# Patient Record
Sex: Male | Born: 1955 | Race: White | Hispanic: No | Smoking: Current every day smoker
Health system: Southern US, Community
[De-identification: ages and names within clinical notes are randomized; demographics above are authoritative.]

## PROBLEM LIST (undated history)

## (undated) DIAGNOSIS — I10 Essential (primary) hypertension: Secondary | ICD-10-CM

## (undated) DIAGNOSIS — T79A21A Traumatic compartment syndrome of right lower extremity, initial encounter: Secondary | ICD-10-CM

## (undated) DIAGNOSIS — K219 Gastro-esophageal reflux disease without esophagitis: Secondary | ICD-10-CM

## (undated) DIAGNOSIS — M978XXA Periprosthetic fracture around other internal prosthetic joint, initial encounter: Secondary | ICD-10-CM

## (undated) DIAGNOSIS — F410 Panic disorder [episodic paroxysmal anxiety] without agoraphobia: Secondary | ICD-10-CM

## (undated) DIAGNOSIS — M549 Dorsalgia, unspecified: Secondary | ICD-10-CM

## (undated) DIAGNOSIS — M1990 Unspecified osteoarthritis, unspecified site: Secondary | ICD-10-CM

## (undated) DIAGNOSIS — Z96659 Presence of unspecified artificial knee joint: Secondary | ICD-10-CM

---

## 2004-01-17 ENCOUNTER — Emergency Department (HOSPITAL_COMMUNITY): Admission: EM | Admit: 2004-01-17 | Discharge: 2004-01-17 | Payer: Self-pay | Admitting: Emergency Medicine

## 2005-12-24 ENCOUNTER — Emergency Department (HOSPITAL_COMMUNITY): Admission: EM | Admit: 2005-12-24 | Discharge: 2005-12-24 | Payer: Self-pay | Admitting: *Deleted

## 2008-04-07 ENCOUNTER — Emergency Department (HOSPITAL_COMMUNITY): Admission: EM | Admit: 2008-04-07 | Discharge: 2008-04-07 | Payer: Self-pay | Admitting: Emergency Medicine

## 2008-06-01 ENCOUNTER — Emergency Department (HOSPITAL_COMMUNITY): Admission: EM | Admit: 2008-06-01 | Discharge: 2008-06-01 | Payer: Self-pay | Admitting: Emergency Medicine

## 2008-10-20 ENCOUNTER — Emergency Department (HOSPITAL_BASED_OUTPATIENT_CLINIC_OR_DEPARTMENT_OTHER): Admission: EM | Admit: 2008-10-20 | Discharge: 2008-10-20 | Payer: Self-pay | Admitting: Emergency Medicine

## 2008-10-20 ENCOUNTER — Ambulatory Visit: Payer: Self-pay | Admitting: Radiology

## 2009-06-23 ENCOUNTER — Ambulatory Visit: Payer: Self-pay | Admitting: Radiology

## 2009-06-23 ENCOUNTER — Emergency Department (HOSPITAL_BASED_OUTPATIENT_CLINIC_OR_DEPARTMENT_OTHER): Admission: EM | Admit: 2009-06-23 | Discharge: 2009-06-23 | Payer: Self-pay | Admitting: Emergency Medicine

## 2010-08-29 LAB — URINE MICROSCOPIC-ADD ON

## 2010-08-29 LAB — COMPREHENSIVE METABOLIC PANEL
AST: 29 U/L (ref 0–37)
Albumin: 3.7 g/dL (ref 3.5–5.2)
CO2: 26 mEq/L (ref 19–32)
Calcium: 9.1 mg/dL (ref 8.4–10.5)
Chloride: 99 mEq/L (ref 96–112)
GFR calc Af Amer: >60 mL/min (ref >60)
GFR calc non Af Amer: >60 mL/min (ref >60)
Sodium: 135 mEq/L (ref 135–145)
Total Bilirubin: 1.1 mg/dL (ref 0.3–1.2)
Total Protein: 7.1 g/dL (ref 6.0–8.3)

## 2010-08-29 LAB — GC/CHLAMYDIA PROBE AMP, GENITAL
Chlamydia, DNA Probe: NEGATIVE
GC Probe Amp, Genital: NEGATIVE

## 2010-08-29 LAB — DIFFERENTIAL
Basophils Absolute: 0.1 10*3/uL (ref 0.0–0.1)
Basophils Relative: 1 % (ref 0–1)
Eosinophils Relative: 0 % (ref 0–5)
Neutrophils Relative %: 79 % — ABNORMAL HIGH (ref 43–77)

## 2010-08-29 LAB — CBC
HCT: 46.1 % (ref 39.0–52.0)
MCV: 100.3 fL — ABNORMAL HIGH (ref 78.0–100.0)
Platelets: 216 10*3/uL (ref 150–400)
RDW: 14.3 % (ref 11.5–15.5)
WBC: 9.2 10*3/uL (ref 4.0–10.5)

## 2010-08-29 LAB — URINALYSIS, ROUTINE W REFLEX MICROSCOPIC
Glucose, UA: NEGATIVE mg/dL
Specific Gravity, Urine: 1.021 (ref 1.005–1.030)

## 2010-11-23 ENCOUNTER — Encounter: Payer: Self-pay | Admitting: *Deleted

## 2010-11-23 ENCOUNTER — Emergency Department (HOSPITAL_BASED_OUTPATIENT_CLINIC_OR_DEPARTMENT_OTHER)
Admission: EM | Admit: 2010-11-23 | Discharge: 2010-11-23 | Disposition: A | Discharge status: Home / Self Care | Payer: Self-pay | Attending: Emergency Medicine | Admitting: Emergency Medicine

## 2010-11-23 DIAGNOSIS — F411 Generalized anxiety disorder: Secondary | ICD-10-CM | POA: Insufficient documentation

## 2010-11-23 DIAGNOSIS — M549 Dorsalgia, unspecified: Secondary | ICD-10-CM | POA: Insufficient documentation

## 2010-11-23 DIAGNOSIS — F410 Panic disorder [episodic paroxysmal anxiety] without agoraphobia: Secondary | ICD-10-CM | POA: Insufficient documentation

## 2010-11-23 HISTORY — DX: Panic disorder (episodic paroxysmal anxiety): F41.0

## 2010-11-23 HISTORY — DX: Dorsalgia, unspecified: M54.9

## 2010-11-23 MED ORDER — TRAZODONE HCL 50 MG PO TABS: 50.0000 mg | ORAL_TABLET | Freq: Every day | ORAL | Status: DC

## 2010-11-23 MED ORDER — ALPRAZOLAM 1 MG PO TABS: 1.0000 mg | ORAL_TABLET | Freq: Three times a day (TID) | ORAL | Status: AC

## 2010-11-23 MED ORDER — PAROXETINE HCL 20 MG PO TABS: 40.0000 mg | ORAL_TABLET | ORAL | Status: DC

## 2010-11-23 NOTE — ED Notes (Signed)
States has been out of meds x 2 months and has 10 days to go before his next appointment for his panic attacks.  Normally takes paxil   Xanax    And trazadone.  Denies pain.  anxious

## 2010-11-23 NOTE — ED Notes (Signed)
Regular meds are paxil CR 25mg  x3 in the am     Xanax0..1mg  x3 a day    And trazadone 100mg  x2 at bedtime

## 2010-11-23 NOTE — ED Provider Notes (Signed)
History   pt here with worsening panic and anxiety x 1 month--on no meds x 1 month--denies Hi/SI--under lots of stress, has follow-up appointment in 10 days scheudled, requesting refill of meds--denies recent etoh use--no other c/o  Chief Complaint  Patient presents with  . Panic Attack   HPI  Past Medical History  Diagnosis Date  . Panic attack   . Back pain     History reviewed. No pertinent past surgical history.  History reviewed. No pertinent family history.  History  Substance Use Topics  . Smoking status: Current Everyday Smoker -- 0.5 packs/day  . Smokeless tobacco: Not on file  . Alcohol Use: No      Review of Systems  All other systems reviewed and are negative.    Physical Exam  BP 140/112  Pulse 92  Temp(Src) 98.5 F (36.9 C) (Oral)  Resp 16  Wt 200 lb (90.719 kg)  SpO2 99%  Physical Exam  Nursing note and vitals reviewed. Constitutional: He is oriented to person, place, and time. Vital signs are normal. He appears well-developed and well-nourished.  Non-toxic appearance. No distress.  HENT:  Head: Normocephalic and atraumatic.  Eyes: Conjunctivae and EOM are normal. Pupils are equal, round, and reactive to light.  Neck: Normal range of motion. Neck supple. No tracheal deviation present.  Cardiovascular: Normal rate, regular rhythm and normal heart sounds.  Exam reveals no gallop.   No murmur heard. Pulmonary/Chest: Effort normal and breath sounds normal. No stridor. No respiratory distress. He has no wheezes.  Abdominal: Soft. Normal appearance and bowel sounds are normal. He exhibits no distension. There is no tenderness. There is no rebound.  Musculoskeletal: Normal range of motion. He exhibits no edema and no tenderness.  Neurological: He is alert and oriented to person, place, and time. He has normal strength. No cranial nerve deficit or sensory deficit. GCS eye subscore is 4. GCS verbal subscore is 5. GCS motor subscore is 6.  Skin: Skin is  warm and dry.  Psychiatric: His speech is normal. His mood appears anxious.    ED Course  Procedures  MDM Will refill his meds and he will f/u with his new psychiatrist as already scheduled      Toy Baker, MD 11/23/10 1709

## 2010-11-23 NOTE — ED Notes (Signed)
Pt c/o panic attacks  X 1 month , pt states he is out of paxil, trazadone, xanax.

## 2010-11-23 NOTE — ED Notes (Signed)
Much calmer.  

## 2010-12-23 NOTE — ED Notes (Signed)
Much calmer.  

## 2011-07-20 ENCOUNTER — Emergency Department (HOSPITAL_BASED_OUTPATIENT_CLINIC_OR_DEPARTMENT_OTHER)
Admission: EM | Admit: 2011-07-20 | Discharge: 2011-07-20 | Disposition: A | Discharge status: Home / Self Care | Payer: Self-pay | Attending: Emergency Medicine | Admitting: Emergency Medicine

## 2011-07-20 ENCOUNTER — Encounter (HOSPITAL_BASED_OUTPATIENT_CLINIC_OR_DEPARTMENT_OTHER): Payer: Self-pay | Admitting: *Deleted

## 2011-07-20 DIAGNOSIS — X04XXXA Exposure to ignition of highly flammable material, initial encounter: Secondary | ICD-10-CM | POA: Insufficient documentation

## 2011-07-20 DIAGNOSIS — T23269A Burn of second degree of back of unspecified hand, initial encounter: Secondary | ICD-10-CM | POA: Insufficient documentation

## 2011-07-20 DIAGNOSIS — Z880 Allergy status to penicillin: Secondary | ICD-10-CM | POA: Insufficient documentation

## 2011-07-20 DIAGNOSIS — F410 Panic disorder [episodic paroxysmal anxiety] without agoraphobia: Secondary | ICD-10-CM | POA: Insufficient documentation

## 2011-07-20 DIAGNOSIS — T2210XA Burn of first degree of shoulder and upper limb, except wrist and hand, unspecified site, initial encounter: Secondary | ICD-10-CM

## 2011-07-20 DIAGNOSIS — T23202A Burn of second degree of left hand, unspecified site, initial encounter: Secondary | ICD-10-CM

## 2011-07-20 DIAGNOSIS — T2014XA Burn of first degree of nose (septum), initial encounter: Secondary | ICD-10-CM | POA: Insufficient documentation

## 2011-07-20 DIAGNOSIS — T2010XA Burn of first degree of head, face, and neck, unspecified site, initial encounter: Secondary | ICD-10-CM

## 2011-07-20 DIAGNOSIS — F17200 Nicotine dependence, unspecified, uncomplicated: Secondary | ICD-10-CM | POA: Insufficient documentation

## 2011-07-20 MED ORDER — OXYCODONE-ACETAMINOPHEN 5-325 MG PO TABS
2.0000 | ORAL_TABLET | Freq: Once | ORAL | Status: AC
  Administered 2011-07-20: 2 via ORAL
  Filled 2011-07-20: qty 2

## 2011-07-20 MED ORDER — OXYCODONE-ACETAMINOPHEN 5-325 MG PO TABS: 1.0000 | ORAL_TABLET | ORAL | Status: AC | PRN

## 2011-07-20 MED ORDER — SILVER SULFADIAZINE 1 % EX CREA
TOPICAL_CREAM | Freq: Once | CUTANEOUS | Status: AC
  Administered 2011-07-20: 1 via TOPICAL

## 2011-07-20 MED ORDER — SILVER SULFADIAZINE 1 % EX CREA
TOPICAL_CREAM | CUTANEOUS | Status: AC
  Administered 2011-07-20: 1 via TOPICAL
  Filled 2011-07-20: qty 85
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
## 2011-08-02 ENCOUNTER — Emergency Department (HOSPITAL_BASED_OUTPATIENT_CLINIC_OR_DEPARTMENT_OTHER)
Admission: EM | Admit: 2011-08-02 | Discharge: 2011-08-02 | Disposition: A | Discharge status: Home Health | Payer: Self-pay | Attending: Emergency Medicine | Admitting: Emergency Medicine

## 2011-08-02 ENCOUNTER — Emergency Department (INDEPENDENT_AMBULATORY_CARE_PROVIDER_SITE_OTHER): Payer: Self-pay

## 2011-08-02 ENCOUNTER — Other Ambulatory Visit: Payer: Self-pay

## 2011-08-02 ENCOUNTER — Encounter (HOSPITAL_BASED_OUTPATIENT_CLINIC_OR_DEPARTMENT_OTHER): Payer: Self-pay

## 2011-08-02 DIAGNOSIS — R0789 Other chest pain: Secondary | ICD-10-CM

## 2011-08-02 DIAGNOSIS — R0602 Shortness of breath: Secondary | ICD-10-CM

## 2011-08-02 DIAGNOSIS — F17200 Nicotine dependence, unspecified, uncomplicated: Secondary | ICD-10-CM | POA: Insufficient documentation

## 2011-08-02 DIAGNOSIS — R079 Chest pain, unspecified: Secondary | ICD-10-CM

## 2011-08-02 LAB — COMPREHENSIVE METABOLIC PANEL
AST: 34 U/L (ref 0–37)
Albumin: 3.7 g/dL (ref 3.5–5.2)
CO2: 27 mEq/L (ref 19–32)
Calcium: 9.5 mg/dL (ref 8.4–10.5)
Creatinine, Ser: 1.1 mg/dL (ref 0.50–1.35)
GFR calc non Af Amer: 73 mL/min — ABNORMAL LOW (ref >90)
Potassium: 3.9 mEq/L (ref 3.5–5.1)
Total Protein: 7.5 g/dL (ref 6.0–8.3)

## 2011-08-02 LAB — DIFFERENTIAL
Basophils Relative: 1 % (ref 0–1)
Eosinophils Absolute: 0.2 10*3/uL (ref 0.0–0.7)
Lymphocytes Relative: 17 % (ref 12–46)
Lymphs Abs: 1.4 10*3/uL (ref 0.7–4.0)
Neutro Abs: 6 10*3/uL (ref 1.7–7.7)

## 2011-08-02 LAB — CBC
HCT: 44.2 % (ref 39.0–52.0)
Hemoglobin: 15.4 g/dL (ref 13.0–17.0)
MCH: 32.8 pg (ref 26.0–34.0)

## 2011-08-02 LAB — CARDIAC PANEL(CRET KIN+CKTOT+MB+TROPI)
CK, MB: 3.4 ng/mL (ref 0.3–4.0)
Total CK: 222 U/L (ref 7–232)

## 2011-08-02 MED ORDER — IBUPROFEN 600 MG PO TABS: 600.0000 mg | ORAL_TABLET | Freq: Four times a day (QID) | ORAL | Status: AC | PRN

## 2011-08-02 MED ORDER — HYDROCODONE-ACETAMINOPHEN 5-325 MG PO TABS: 2.0000 | ORAL_TABLET | ORAL | Status: AC | PRN

## 2011-08-02 NOTE — ED Notes (Signed)
Pt states he has had pain in chest for 24hrs

## 2011-08-02 NOTE — Discharge Instructions (Signed)
Chest Wall Pain Chest wall pain is pain in or around the bones and muscles of your chest. It may take up to 6 weeks to get better. It may take longer if you must stay physically active in your work and activities.  CAUSES  Chest wall pain may happen on its own. However, it may be caused by:  A viral illness like the flu.   Injury.   Coughing.   Exercise.   Arthritis.   Fibromyalgia.   Shingles.  HOME CARE INSTRUCTIONS   Avoid overtiring physical activity. Try not to strain or perform activities that cause pain. This includes any activities using your chest or your abdominal and side muscles, especially if heavy weights are used.   Put ice on the sore area.   Put ice in a plastic bag.   Place a towel between your skin and the bag.   Leave the ice on for 15 to 20 minutes per hour while awake for the first 2 days.   Only take over-the-counter or prescription medicines for pain, discomfort, or fever as directed by your caregiver.  SEEK IMMEDIATE MEDICAL CARE IF:   Your pain increases, or you are very uncomfortable.   You have a fever.   Your chest pain becomes worse.   You have new, unexplained symptoms.   You have nausea or vomiting.   You feel sweaty or lightheaded.   You have a cough with phlegm (sputum), or you cough up blood.  MAKE SURE YOU:   Understand these instructions.   Will watch your condition.   Will get help right away if you are not doing well or get worse.  Document Released: 05/01/2005 Document Revised: 04/20/2011 Document Reviewed: 12/26/2010 [employer] Patient Information 2012 [employer]. 
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
## 2013-10-14 ENCOUNTER — Other Ambulatory Visit: Payer: Self-pay | Admitting: Orthopedic Surgery

## 2013-10-17 NOTE — Progress Notes (Signed)
I was unable to reach patient by phone.  I left  A message on voice mail.  I instructed the patient to arrive at [facility] Main entrance at 10:00 on Monday, June 8  , nothing to eat or drink after midnight.   I instructed the patient to take the following medications in the am with just enough water to get them down: Paxil, Xanax if needed. I asked patient to not wear any lotions, powders, cologne, jewelry, piercing, make-up or nail polish.  I asked the patient to call [phone], in the am if there were any questions or problems.

## 2013-10-19 MED ORDER — CLINDAMYCIN PHOSPHATE 900 MG/50ML IV SOLN
900.0000 mg | INTRAVENOUS | Status: AC
  Administered 2013-10-20: 900 mg via INTRAVENOUS
  Filled 2013-10-19: qty 50

## 2013-10-20 ENCOUNTER — Inpatient Hospital Stay (HOSPITAL_COMMUNITY): Payer: BC Managed Care – PPO

## 2013-10-20 ENCOUNTER — Encounter (HOSPITAL_COMMUNITY): Payer: BC Managed Care – PPO | Admitting: Anesthesiology

## 2013-10-20 ENCOUNTER — Inpatient Hospital Stay (HOSPITAL_COMMUNITY): Payer: BC Managed Care – PPO | Admitting: Anesthesiology

## 2013-10-20 ENCOUNTER — Encounter (HOSPITAL_COMMUNITY): Payer: Self-pay | Admitting: *Deleted

## 2013-10-20 ENCOUNTER — Inpatient Hospital Stay (HOSPITAL_COMMUNITY)
Admission: RE | Admit: 2013-10-20 | Discharge: 2013-10-22 | DRG: 470 | Disposition: A | Discharge status: Home Health | Payer: BC Managed Care – PPO | Source: Ambulatory Visit | Attending: Orthopedic Surgery | Admitting: Orthopedic Surgery

## 2013-10-20 ENCOUNTER — Encounter (HOSPITAL_COMMUNITY)
Admission: RE | Disposition: A | Discharge status: Home Health | Payer: Self-pay | Source: Ambulatory Visit | Attending: Orthopedic Surgery

## 2013-10-20 DIAGNOSIS — M1710 Unilateral primary osteoarthritis, unspecified knee: Principal | ICD-10-CM | POA: Diagnosis present

## 2013-10-20 DIAGNOSIS — F411 Generalized anxiety disorder: Secondary | ICD-10-CM | POA: Diagnosis present

## 2013-10-20 DIAGNOSIS — K219 Gastro-esophageal reflux disease without esophagitis: Secondary | ICD-10-CM | POA: Diagnosis present

## 2013-10-20 DIAGNOSIS — Z7982 Long term (current) use of aspirin: Secondary | ICD-10-CM

## 2013-10-20 DIAGNOSIS — M1711 Unilateral primary osteoarthritis, right knee: Secondary | ICD-10-CM | POA: Diagnosis present

## 2013-10-20 DIAGNOSIS — F17200 Nicotine dependence, unspecified, uncomplicated: Secondary | ICD-10-CM | POA: Diagnosis present

## 2013-10-20 DIAGNOSIS — Z79899 Other long term (current) drug therapy: Secondary | ICD-10-CM

## 2013-10-20 HISTORY — PX: TOTAL KNEE ARTHROPLASTY: SHX125

## 2013-10-20 HISTORY — DX: Gastro-esophageal reflux disease without esophagitis: K21.9

## 2013-10-20 HISTORY — DX: Unspecified osteoarthritis, unspecified site: M19.90

## 2013-10-20 LAB — URINALYSIS, ROUTINE W REFLEX MICROSCOPIC

## 2013-10-20 LAB — CBC WITH DIFFERENTIAL/PLATELET
BASOS PCT: 1 % (ref 0–1)
Basophils Absolute: 0 10*3/uL (ref 0.0–0.1)
Eosinophils Absolute: 0.3 10*3/uL (ref 0.0–0.7)
Eosinophils Relative: 4 % (ref 0–5)
HEMATOCRIT: 44.3 % (ref 39.0–52.0)
Hemoglobin: 14.6 g/dL (ref 13.0–17.0)
LYMPHS PCT: 28 % (ref 12–46)
Lymphs Abs: 2.2 10*3/uL (ref 0.7–4.0)
MCH: 32.9 pg (ref 26.0–34.0)
MCHC: 33 g/dL (ref 30.0–36.0)
MCV: 99.8 fL (ref 78.0–100.0)
MONO ABS: 0.8 10*3/uL (ref 0.1–1.0)
Monocytes Relative: 10 % (ref 3–12)
Neutro Abs: 4.5 10*3/uL (ref 1.7–7.7)
Neutrophils Relative %: 57 % (ref 43–77)
Platelets: 246 10*3/uL (ref 150–400)
RBC: 4.44 MIL/uL (ref 4.22–5.81)
RDW: 13.5 % (ref 11.5–15.5)
WBC: 7.9 10*3/uL (ref 4.0–10.5)

## 2013-10-20 LAB — COMPREHENSIVE METABOLIC PANEL
ALT: 37 U/L (ref 0–53)
AST: 29 U/L (ref 0–37)
Albumin: 3.3 g/dL — ABNORMAL LOW (ref 3.5–5.2)
Alkaline Phosphatase: 56 U/L (ref 39–117)
BUN: 12 mg/dL (ref 6–23)
CALCIUM: 9.3 mg/dL (ref 8.4–10.5)
CO2: 28 meq/L (ref 19–32)
CREATININE: 1.07 mg/dL (ref 0.50–1.35)
Chloride: 103 mEq/L (ref 96–112)
GFR calc Af Amer: 85 mL/min — ABNORMAL LOW (ref >90)
GFR, EST NON AFRICAN AMERICAN: 74 mL/min — AB (ref >90)
Glucose, Bld: 84 mg/dL (ref 70–99)
Potassium: 4.1 mEq/L (ref 3.7–5.3)
Sodium: 141 mEq/L (ref 137–147)
Total Bilirubin: 0.3 mg/dL (ref 0.3–1.2)
Total Protein: 6.7 g/dL (ref 6.0–8.3)

## 2013-10-20 LAB — TYPE AND SCREEN
ABO/RH(D): A POS
ANTIBODY SCREEN: NEGATIVE

## 2013-10-20 LAB — PROTIME-INR
INR: 0.92 (ref 0.00–1.49)
Prothrombin Time: 12.2 seconds (ref 11.6–15.2)

## 2013-10-20 LAB — APTT: aPTT: 36 seconds (ref 24–37)

## 2013-10-20 LAB — SURGICAL PCR SCREEN
MRSA, PCR: NEGATIVE
Staphylococcus aureus: NEGATIVE

## 2013-10-20 LAB — ABO/RH: ABO/RH(D): A POS

## 2013-10-20 SURGERY — ARTHROPLASTY, KNEE, TOTAL
Anesthesia: General | Site: Knee | Laterality: Right

## 2013-10-20 MED ORDER — DEXAMETHASONE SODIUM PHOSPHATE 10 MG/ML IJ SOLN
10.0000 mg | Freq: Three times a day (TID) | INTRAMUSCULAR | Status: AC
  Filled 2013-10-20 (×3): qty 1

## 2013-10-20 MED ORDER — LIDOCAINE HCL (CARDIAC) 20 MG/ML IV SOLN
INTRAVENOUS | Status: AC
  Filled 2013-10-20: qty 5

## 2013-10-20 MED ORDER — OXYCODONE-ACETAMINOPHEN 5-325 MG PO TABS
ORAL_TABLET | ORAL | Status: AC
  Administered 2013-10-20: 2 via ORAL
  Filled 2013-10-20: qty 2

## 2013-10-20 MED ORDER — DIPHENHYDRAMINE HCL 12.5 MG/5ML PO ELIX: 12.5000 mg | ORAL_SOLUTION | ORAL | Status: DC | PRN

## 2013-10-20 MED ORDER — OXYCODONE-ACETAMINOPHEN 5-325 MG PO TABS: 1.0000 | ORAL_TABLET | ORAL | Status: DC | PRN

## 2013-10-20 MED ORDER — PROPOFOL 10 MG/ML IV BOLUS
INTRAVENOUS | Status: DC | PRN
  Administered 2013-10-20: 200 mg via INTRAVENOUS

## 2013-10-20 MED ORDER — LACTATED RINGERS IV SOLN
INTRAVENOUS | Status: DC
  Administered 2013-10-20: 11:00:00 via INTRAVENOUS

## 2013-10-20 MED ORDER — KETOROLAC TROMETHAMINE 15 MG/ML IJ SOLN
INTRAMUSCULAR | Status: AC
  Administered 2013-10-20: 15 mg via INTRAVENOUS
  Filled 2013-10-20: qty 1

## 2013-10-20 MED ORDER — ALPRAZOLAM 0.5 MG PO TABS
1.0000 mg | ORAL_TABLET | Freq: Three times a day (TID) | ORAL | Status: DC | PRN
  Administered 2013-10-21 – 2013-10-22 (×4): 1 mg via ORAL
  Filled 2013-10-20 (×4): qty 2

## 2013-10-20 MED ORDER — PAROXETINE HCL 20 MG PO TABS
40.0000 mg | ORAL_TABLET | ORAL | Status: DC
  Administered 2013-10-21 – 2013-10-22 (×2): 40 mg via ORAL
  Filled 2013-10-20 (×3): qty 2

## 2013-10-20 MED ORDER — DEXAMETHASONE SODIUM PHOSPHATE 10 MG/ML IJ SOLN: 10.0000 mg | Freq: Once | INTRAMUSCULAR | Status: DC

## 2013-10-20 MED ORDER — ACETAMINOPHEN 650 MG RE SUPP: 650.0000 mg | Freq: Four times a day (QID) | RECTAL | Status: DC | PRN

## 2013-10-20 MED ORDER — 0.9 % SODIUM CHLORIDE (POUR BTL) OPTIME
TOPICAL | Status: DC | PRN
  Administered 2013-10-20: 1000 mL

## 2013-10-20 MED ORDER — SODIUM CHLORIDE 0.9 % IJ SOLN
INTRAMUSCULAR | Status: DC | PRN
  Administered 2013-10-20: 40 mL

## 2013-10-20 MED ORDER — HYDROMORPHONE HCL PF 1 MG/ML IJ SOLN
INTRAMUSCULAR | Status: AC
  Administered 2013-10-20: 16:00:00
  Filled 2013-10-20: qty 1

## 2013-10-20 MED ORDER — NEOSTIGMINE METHYLSULFATE 10 MG/10ML IV SOLN
INTRAVENOUS | Status: DC | PRN
  Administered 2013-10-20: 4 mg via INTRAVENOUS

## 2013-10-20 MED ORDER — CLINDAMYCIN PHOSPHATE 600 MG/50ML IV SOLN
600.0000 mg | Freq: Four times a day (QID) | INTRAVENOUS | Status: AC
  Administered 2013-10-20 – 2013-10-21 (×2): 600 mg via INTRAVENOUS
  Filled 2013-10-20 (×2): qty 50

## 2013-10-20 MED ORDER — OXYCODONE-ACETAMINOPHEN 5-325 MG PO TABS
1.0000 | ORAL_TABLET | ORAL | Status: DC | PRN
  Administered 2013-10-20 – 2013-10-22 (×8): 2 via ORAL
  Filled 2013-10-20 (×7): qty 2

## 2013-10-20 MED ORDER — METOCLOPRAMIDE HCL 5 MG/ML IJ SOLN: 10.0000 mg | Freq: Once | INTRAMUSCULAR | Status: DC | PRN

## 2013-10-20 MED ORDER — DEXTROSE 5 % IV SOLN
INTRAVENOUS | Status: DC | PRN
  Administered 2013-10-20: 13:00:00 via INTRAVENOUS

## 2013-10-20 MED ORDER — HYDROMORPHONE HCL PF 1 MG/ML IJ SOLN
1.0000 mg | INTRAMUSCULAR | Status: DC | PRN
  Administered 2013-10-20: 1 mg via INTRAVENOUS
  Filled 2013-10-20: qty 1

## 2013-10-20 MED ORDER — BUPIVACAINE LIPOSOME 1.3 % IJ SUSP
20.0000 mL | Freq: Once | INTRAMUSCULAR | Status: DC
  Filled 2013-10-20: qty 20

## 2013-10-20 MED ORDER — POLYETHYLENE GLYCOL 3350 17 G PO PACK: 17.0000 g | PACK | Freq: Every day | ORAL | Status: DC | PRN

## 2013-10-20 MED ORDER — ASPIRIN EC 325 MG PO TBEC: 325.0000 mg | DELAYED_RELEASE_TABLET | Freq: Two times a day (BID) | ORAL | Status: DC

## 2013-10-20 MED ORDER — OXYCODONE HCL 5 MG PO TABS
5.0000 mg | ORAL_TABLET | Freq: Once | ORAL | Status: AC | PRN
  Administered 2013-10-20: 5 mg via ORAL

## 2013-10-20 MED ORDER — ROCURONIUM BROMIDE 50 MG/5ML IV SOLN
INTRAVENOUS | Status: AC
  Filled 2013-10-20: qty 1

## 2013-10-20 MED ORDER — HYDROMORPHONE HCL PF 1 MG/ML IJ SOLN
0.2500 mg | INTRAMUSCULAR | Status: DC | PRN
Start: 2013-10-20 — End: 2013-10-20
  Administered 2013-10-20 (×2): 0.5 mg via INTRAVENOUS

## 2013-10-20 MED ORDER — DEXAMETHASONE 6 MG PO TABS
10.0000 mg | ORAL_TABLET | Freq: Three times a day (TID) | ORAL | Status: AC
  Administered 2013-10-20 – 2013-10-21 (×3): 10 mg via ORAL
  Filled 2013-10-20 (×5): qty 1

## 2013-10-20 MED ORDER — SODIUM CHLORIDE 0.9 % IV SOLN
INTRAVENOUS | Status: DC
  Administered 2013-10-20: 100 mL/h via INTRAVENOUS

## 2013-10-20 MED ORDER — METHOCARBAMOL 500 MG PO TABS
500.0000 mg | ORAL_TABLET | Freq: Four times a day (QID) | ORAL | Status: DC | PRN
  Administered 2013-10-20 – 2013-10-22 (×5): 500 mg via ORAL
  Filled 2013-10-20 (×5): qty 1

## 2013-10-20 MED ORDER — ASPIRIN EC 325 MG PO TBEC
325.0000 mg | DELAYED_RELEASE_TABLET | Freq: Two times a day (BID) | ORAL | Status: DC
  Administered 2013-10-20 – 2013-10-21 (×3): 325 mg via ORAL
  Filled 2013-10-20 (×6): qty 1

## 2013-10-20 MED ORDER — ARTIFICIAL TEARS OP OINT
TOPICAL_OINTMENT | OPHTHALMIC | Status: AC
  Filled 2013-10-20: qty 3.5

## 2013-10-20 MED ORDER — DOCUSATE SODIUM 100 MG PO CAPS
100.0000 mg | ORAL_CAPSULE | Freq: Two times a day (BID) | ORAL | Status: DC
  Administered 2013-10-20 – 2013-10-21 (×3): 100 mg via ORAL
  Filled 2013-10-20 (×5): qty 1

## 2013-10-20 MED ORDER — ONDANSETRON HCL 4 MG/2ML IJ SOLN
INTRAMUSCULAR | Status: AC
  Filled 2013-10-20: qty 2

## 2013-10-20 MED ORDER — ONDANSETRON HCL 4 MG PO TABS: 4.0000 mg | ORAL_TABLET | Freq: Four times a day (QID) | ORAL | Status: DC | PRN

## 2013-10-20 MED ORDER — MIDAZOLAM HCL 2 MG/2ML IJ SOLN
INTRAMUSCULAR | Status: AC
  Administered 2013-10-20: 2 mg
  Filled 2013-10-20: qty 2

## 2013-10-20 MED ORDER — LABETALOL HCL 5 MG/ML IV SOLN
INTRAVENOUS | Status: DC | PRN
  Administered 2013-10-20: 2.5 mg via INTRAVENOUS

## 2013-10-20 MED ORDER — FENTANYL CITRATE 0.05 MG/ML IJ SOLN
INTRAMUSCULAR | Status: AC
  Filled 2013-10-20: qty 5

## 2013-10-20 MED ORDER — TRANEXAMIC ACID 100 MG/ML IV SOLN
1000.0000 mg | INTRAVENOUS | Status: AC
  Administered 2013-10-20: 1000 mg via INTRAVENOUS
  Filled 2013-10-20: qty 10

## 2013-10-20 MED ORDER — BUPIVACAINE HCL (PF) 0.5 % IJ SOLN
INTRAMUSCULAR | Status: DC | PRN
  Administered 2013-10-20: 25 mL via PERINEURAL

## 2013-10-20 MED ORDER — OXYCODONE HCL 5 MG PO TABS
ORAL_TABLET | ORAL | Status: AC
  Administered 2013-10-20: 16:00:00
  Filled 2013-10-20: qty 1

## 2013-10-20 MED ORDER — DEXAMETHASONE SODIUM PHOSPHATE 10 MG/ML IJ SOLN
INTRAMUSCULAR | Status: AC
  Filled 2013-10-20: qty 1

## 2013-10-20 MED ORDER — ACETAMINOPHEN 325 MG PO TABS: 650.0000 mg | ORAL_TABLET | Freq: Four times a day (QID) | ORAL | Status: DC | PRN

## 2013-10-20 MED ORDER — FENTANYL CITRATE 0.05 MG/ML IJ SOLN
INTRAMUSCULAR | Status: AC
  Administered 2013-10-20: 100 ug
  Filled 2013-10-20: qty 2

## 2013-10-20 MED ORDER — FENTANYL CITRATE 0.05 MG/ML IJ SOLN
INTRAMUSCULAR | Status: DC | PRN
  Administered 2013-10-20: 100 ug via INTRAVENOUS
  Administered 2013-10-20: 50 ug via INTRAVENOUS
  Administered 2013-10-20: 100 ug via INTRAVENOUS

## 2013-10-20 MED ORDER — DEXAMETHASONE SODIUM PHOSPHATE 10 MG/ML IJ SOLN
INTRAMUSCULAR | Status: DC | PRN
  Administered 2013-10-20: 10 mg via INTRAVENOUS

## 2013-10-20 MED ORDER — ONDANSETRON HCL 4 MG/2ML IJ SOLN: 4.0000 mg | Freq: Four times a day (QID) | INTRAMUSCULAR | Status: DC | PRN

## 2013-10-20 MED ORDER — METHOCARBAMOL 500 MG PO TABS
ORAL_TABLET | ORAL | Status: AC
  Administered 2013-10-20: 16:00:00
  Filled 2013-10-20: qty 1

## 2013-10-20 MED ORDER — ALUM & MAG HYDROXIDE-SIMETH 200-200-20 MG/5ML PO SUSP
30.0000 mL | ORAL | Status: DC | PRN
  Administered 2013-10-21 – 2013-10-22 (×2): 30 mL via ORAL
  Filled 2013-10-20 (×2): qty 30

## 2013-10-20 MED ORDER — PHENYLEPHRINE HCL 10 MG/ML IJ SOLN
INTRAMUSCULAR | Status: DC | PRN
  Administered 2013-10-20 (×4): 40 ug via INTRAVENOUS
  Administered 2013-10-20: 80 ug via INTRAVENOUS

## 2013-10-20 MED ORDER — GLYCOPYRROLATE 0.2 MG/ML IJ SOLN
INTRAMUSCULAR | Status: DC | PRN
  Administered 2013-10-20: 0.6 mg via INTRAVENOUS

## 2013-10-20 MED ORDER — BUPIVACAINE HCL (PF) 0.25 % IJ SOLN
INTRAMUSCULAR | Status: AC
  Filled 2013-10-20: qty 30

## 2013-10-20 MED ORDER — METHOCARBAMOL 750 MG PO TABS: 750.0000 mg | ORAL_TABLET | Freq: Three times a day (TID) | ORAL | Status: DC | PRN

## 2013-10-20 MED ORDER — GLYCOPYRROLATE 0.2 MG/ML IJ SOLN
INTRAMUSCULAR | Status: AC
  Filled 2013-10-20: qty 3

## 2013-10-20 MED ORDER — ARTIFICIAL TEARS OP OINT
TOPICAL_OINTMENT | OPHTHALMIC | Status: DC | PRN
  Administered 2013-10-20: 1 via OPHTHALMIC

## 2013-10-20 MED ORDER — METHOCARBAMOL 1000 MG/10ML IJ SOLN
500.0000 mg | Freq: Four times a day (QID) | INTRAVENOUS | Status: DC | PRN
  Filled 2013-10-20: qty 5

## 2013-10-20 MED ORDER — CEFUROXIME SODIUM 1.5 G IJ SOLR
INTRAMUSCULAR | Status: AC
  Filled 2013-10-20: qty 1.5

## 2013-10-20 MED ORDER — ROCURONIUM BROMIDE 100 MG/10ML IV SOLN
INTRAVENOUS | Status: DC | PRN
  Administered 2013-10-20: 50 mg via INTRAVENOUS

## 2013-10-20 MED ORDER — MUPIROCIN 2 % EX OINT
TOPICAL_OINTMENT | Freq: Once | CUTANEOUS | Status: AC
  Administered 2013-10-20: 11:00:00 via NASAL

## 2013-10-20 MED ORDER — MUPIROCIN 2 % EX OINT
TOPICAL_OINTMENT | CUTANEOUS | Status: AC
  Filled 2013-10-20: qty 22

## 2013-10-20 MED ORDER — PROPOFOL 10 MG/ML IV BOLUS
INTRAVENOUS | Status: AC
  Filled 2013-10-20: qty 20

## 2013-10-20 MED ORDER — BUPIVACAINE LIPOSOME 1.3 % IJ SUSP
INTRAMUSCULAR | Status: DC | PRN
  Administered 2013-10-20: 20 mL

## 2013-10-20 MED ORDER — KETOROLAC TROMETHAMINE 15 MG/ML IJ SOLN
15.0000 mg | Freq: Four times a day (QID) | INTRAMUSCULAR | Status: AC
  Administered 2013-10-20 – 2013-10-21 (×4): 15 mg via INTRAVENOUS
  Filled 2013-10-20 (×3): qty 1

## 2013-10-20 MED ORDER — BISACODYL 5 MG PO TBEC: 5.0000 mg | DELAYED_RELEASE_TABLET | Freq: Every day | ORAL | Status: DC | PRN

## 2013-10-20 MED ORDER — CHLORHEXIDINE GLUCONATE 4 % EX LIQD
60.0000 mL | Freq: Once | CUTANEOUS | Status: DC
  Filled 2013-10-20: qty 60

## 2013-10-20 MED ORDER — ONDANSETRON HCL 4 MG/2ML IJ SOLN
INTRAMUSCULAR | Status: DC | PRN
  Administered 2013-10-20: 4 mg via INTRAVENOUS

## 2013-10-20 MED ORDER — OXYCODONE HCL 5 MG/5ML PO SOLN: 5.0000 mg | Freq: Once | ORAL | Status: AC | PRN

## 2013-10-20 MED ORDER — LACTATED RINGERS IV SOLN
INTRAVENOUS | Status: DC | PRN
  Administered 2013-10-20 (×2): via INTRAVENOUS

## 2013-10-20 MED ORDER — TRAZODONE HCL 100 MG PO TABS
100.0000 mg | ORAL_TABLET | Freq: Every evening | ORAL | Status: DC | PRN
  Filled 2013-10-20: qty 1

## 2013-10-20 MED ORDER — LIDOCAINE HCL (CARDIAC) 20 MG/ML IV SOLN
INTRAVENOUS | Status: DC | PRN
  Administered 2013-10-20: 80 mg via INTRAVENOUS

## 2013-10-20 SURGICAL SUPPLY — 65 items
APL SKNCLS STERI-STRIP NONHPOA (GAUZE/BANDAGES/DRESSINGS) ×1
BANDAGE ESMARK 6X9 LF (GAUZE/BANDAGES/DRESSINGS) ×1 IMPLANT
BENZOIN TINCTURE PRP APPL 2/3 (GAUZE/BANDAGES/DRESSINGS) ×3 IMPLANT
BLADE SAGITTAL 25.0X1.19X90 (BLADE) ×2 IMPLANT
BLADE SAGITTAL 25.0X1.19X90MM (BLADE) ×1
BLADE SAW SAG 90X13X1.27 (BLADE) ×3 IMPLANT
BNDG CMPR 9X6 STRL LF SNTH (GAUZE/BANDAGES/DRESSINGS) ×1
BNDG ESMARK 6X9 LF (GAUZE/BANDAGES/DRESSINGS) ×3
BOWL SMART MIX CTS (DISPOSABLE) ×3 IMPLANT
CAPT RP KNEE ×2 IMPLANT
CEMENT HV SMART SET (Cement) ×6 IMPLANT
CLOSURE WOUND 1/2 X4 (GAUZE/BANDAGES/DRESSINGS) ×1
COVER SURGICAL LIGHT HANDLE (MISCELLANEOUS) ×3 IMPLANT
CUFF TOURNIQUET SINGLE 34IN LL (TOURNIQUET CUFF) ×3 IMPLANT
CUFF TOURNIQUET SINGLE 44IN (TOURNIQUET CUFF) IMPLANT
DRAPE EXTREMITY T 121X128X90 (DRAPE) ×3 IMPLANT
DRAPE U-SHAPE 47X51 STRL (DRAPES) ×3 IMPLANT
DRSG PAD ABDOMINAL 8X10 ST (GAUZE/BANDAGES/DRESSINGS) ×3 IMPLANT
DURAPREP 26ML APPLICATOR (WOUND CARE) ×3 IMPLANT
ELECT REM PT RETURN 9FT ADLT (ELECTROSURGICAL) ×3
ELECTRODE REM PT RTRN 9FT ADLT (ELECTROSURGICAL) ×1 IMPLANT
EVACUATOR 1/8 PVC DRAIN (DRAIN) ×3 IMPLANT
FACESHIELD WRAPAROUND (MASK) ×3 IMPLANT
FACESHIELD WRAPAROUND OR TEAM (MASK) ×1 IMPLANT
GAUZE XEROFORM 5X9 LF (GAUZE/BANDAGES/DRESSINGS) ×3 IMPLANT
GLOVE BIOGEL PI IND STRL 8 (GLOVE) ×2 IMPLANT
GLOVE BIOGEL PI INDICATOR 8 (GLOVE) ×4
GLOVE ECLIPSE 7.5 STRL STRAW (GLOVE) ×6 IMPLANT
GOWN STRL REUS W/ TWL LRG LVL3 (GOWN DISPOSABLE) ×1 IMPLANT
GOWN STRL REUS W/ TWL XL LVL3 (GOWN DISPOSABLE) ×2 IMPLANT
GOWN STRL REUS W/TWL LRG LVL3 (GOWN DISPOSABLE) ×3
GOWN STRL REUS W/TWL XL LVL3 (GOWN DISPOSABLE) ×6
HANDPIECE INTERPULSE COAX TIP (DISPOSABLE) ×3
HOOD PEEL AWAY FACE SHEILD DIS (HOOD) ×9 IMPLANT
IMMOBILIZER KNEE 20 (SOFTGOODS) IMPLANT
IMMOBILIZER KNEE 22 UNIV (SOFTGOODS) ×3 IMPLANT
KIT BASIN OR (CUSTOM PROCEDURE TRAY) ×3 IMPLANT
KIT ROOM TURNOVER OR (KITS) ×3 IMPLANT
MANIFOLD NEPTUNE II (INSTRUMENTS) ×3 IMPLANT
NDL HYPO 25GX1X1/2 BEV (NEEDLE) IMPLANT
NEEDLE HYPO 25GX1X1/2 BEV (NEEDLE) IMPLANT
NS IRRIG 1000ML POUR BTL (IV SOLUTION) ×3 IMPLANT
PACK TOTAL JOINT (CUSTOM PROCEDURE TRAY) ×3 IMPLANT
PAD ABD 8X10 STRL (GAUZE/BANDAGES/DRESSINGS) ×2 IMPLANT
PAD ARMBOARD 7.5X6 YLW CONV (MISCELLANEOUS) ×6 IMPLANT
PAD CAST 4YDX4 CTTN HI CHSV (CAST SUPPLIES) ×1 IMPLANT
PADDING CAST ABS 4INX4YD NS (CAST SUPPLIES) ×2
PADDING CAST ABS COTTON 4X4 ST (CAST SUPPLIES) IMPLANT
PADDING CAST COTTON 4X4 STRL (CAST SUPPLIES) ×3
SET HNDPC FAN SPRY TIP SCT (DISPOSABLE) ×1 IMPLANT
SPONGE GAUZE 4X4 12PLY (GAUZE/BANDAGES/DRESSINGS) ×3 IMPLANT
STAPLER VISISTAT 35W (STAPLE) IMPLANT
STRIP CLOSURE SKIN 1/2X4 (GAUZE/BANDAGES/DRESSINGS) ×2 IMPLANT
SUCTION FRAZIER TIP 10 FR DISP (SUCTIONS) ×3 IMPLANT
SUT MNCRL AB 3-0 PS2 18 (SUTURE) IMPLANT
SUT VIC AB 0 CTB1 27 (SUTURE) ×6 IMPLANT
SUT VIC AB 1 CT1 27 (SUTURE) ×6
SUT VIC AB 1 CT1 27XBRD ANBCTR (SUTURE) ×2 IMPLANT
SUT VIC AB 2-0 CTB1 (SUTURE) ×6 IMPLANT
SYR 50ML LL SCALE MARK (SYRINGE) ×3 IMPLANT
SYR CONTROL 10ML LL (SYRINGE) IMPLANT
TOWEL OR 17X24 6PK STRL BLUE (TOWEL DISPOSABLE) ×3 IMPLANT
TOWEL OR 17X26 10 PK STRL BLUE (TOWEL DISPOSABLE) ×3 IMPLANT
TRAY FOLEY CATH 16FRSI W/METER (SET/KITS/TRAYS/PACK) ×3 IMPLANT
WATER STERILE IRR 1000ML POUR (IV SOLUTION) ×6 IMPLANT
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
## 2013-10-20 NOTE — Anesthesia Procedure Notes (Addendum)
Anesthesia Regional Block:  Femoral nerve block  Pre-Anesthetic Checklist: ,, timeout performed, Correct Patient, Correct Site, Correct Laterality, Correct Procedure, Correct Position, site marked, Risks and benefits discussed,  Surgical consent,  Pre-op evaluation,  At surgeon's request and post-op pain management  Laterality: Right  Prep: chloraprep       Needles:   Needle Type: Other     Needle Length: 9cm 9 cm Needle Gauge: 21 and 21 G    Additional Needles:  Procedures: ultrasound guided (picture in chart) Femoral nerve block Narrative:  Start time: 10/20/2013 11:54 AM End time: 10/20/2013 12:00 PM Injection made incrementally with aspirations every 5 mL.  Performed by: Personally  Anesthesiologist: C. Frederick MD  Additional Notes: Ultrasound guidance used to: id relevant anatomy, confirm needle position, local anesthetic spread, avoidance of vascular puncture. Picture saved. No complications. Block performed personally by Janetta Hora. Gelene Mink, MD     Procedure Name: Intubation Date/Time: 10/20/2013 12:50 PM Performed by: Marni Griffon Pre-anesthesia Checklist: Patient identified, Emergency Drugs available, Suction available and Patient being monitored Patient Re-evaluated:Patient Re-evaluated prior to inductionOxygen Delivery Method: Circle system utilized Preoxygenation: Pre-oxygenation with 100% oxygen Intubation Type: IV induction Ventilation: Mask ventilation without difficulty Laryngoscope Size: Mac and 3 Tube type: Oral Tube size: 7.5 mm Number of attempts: 1 Airway Equipment and Method: Stylet Placement Confirmation: ETT inserted through vocal cords under direct vision,  breath sounds checked- equal and bilateral and positive ETCO2 Secured at: 21 (cm at gum) cm Tube secured with: Tape Dental Injury: Teeth and Oropharynx as per pre-operative assessment

## 2013-10-20 NOTE — Progress Notes (Signed)
Orthopedic Tech Progress Note Patient Details:  Joseph Jarvis [birth year] [id number]  CPM Right Knee CPM Right Knee: On Right Knee Flexion (Degrees): 60 Right Knee Extension (Degrees): 0  Ortho Devices Ortho Device/Splint Location: footsie roll Ortho Device/Splint Interventions: Ordered   Jennye Moccasin 10/20/2013, 5:25 PM

## 2013-10-20 NOTE — H&P (Signed)
TOTAL KNEE ADMISSION H&P  Patient is being admitted for right total knee arthroplasty.  Subjective:  Chief Complaint:right knee pain.  HPI: Joseph Jarvis, 58 y.o. male, has a history of pain and functional disability in the right knee due to arthritis and has failed non-surgical conservative treatments for greater than 12 weeks to includeNSAID's and/or analgesics, corticosteriod injections, viscosupplementation injections, flexibility and strengthening excercises, supervised PT with diminished ADL's post treatment, use of assistive devices, weight reduction as appropriate and activity modification.  Onset of symptoms was gradual, starting 5 years ago with gradually worsening course since that time. The patient noted no past surgery on the right knee(s).  Patient currently rates pain in the right knee(s) at 8 out of 10 with activity. Patient has night pain, worsening of pain with activity and weight bearing, pain that interferes with activities of daily living, pain with passive range of motion, crepitus and joint swelling.  Patient has evidence of subchondral cysts, periarticular osteophytes and joint space narrowing by imaging studies. This patient has had ailure of conservative care. There is no active infection.  There are no active problems to display for this patient.  Past Medical History  Diagnosis Date  . Panic attack   . Back pain   . GERD (gastroesophageal reflux disease)   . Arthritis     Past Surgical History  Procedure Laterality Date  . No past surgeries      Prescriptions prior to admission  Medication Sig Dispense Refill  . ALPRAZolam (XANAX) 1 MG tablet Take 1 mg by mouth 3 (three) times daily as needed for anxiety.      Marland Kitchen ibuprofen (ADVIL,MOTRIN) 200 MG tablet Take 200 mg by mouth every 8 (eight) hours as needed for moderate pain. For back pain      . oxyCODONE-acetaminophen (PERCOCET/ROXICET) 5-325 MG per tablet Take 1 tablet by mouth every 6 (six) hours as needed for  severe pain.      Marland Kitchen PARoxetine (PAXIL) 40 MG tablet Take 40 mg by mouth every morning.      . traZODone (DESYREL) 100 MG tablet Take 100 mg by mouth at bedtime as needed for sleep.       Allergies  Allergen Reactions  . Penicillins Swelling    History  Substance Use Topics  . Smoking status: Current Every Day Smoker -- 1.00 packs/day for 30 years    Types: Cigarettes  . Smokeless tobacco: Not on file  . Alcohol Use: Yes     Comment: mixed drink on weekend    History reviewed. No pertinent family history.   ROS ROS: I have reviewed the patient's review of systems thoroughly and there are no positive responses as relates to the HPI. Objective:  Physical Exam  Vital signs in last 24 hours: Temp:  [97.6 F (36.4 C)] 97.6 F (36.4 C) (06/08 1033) Pulse Rate:  [73-82] 82 (06/08 1210) Resp:  [10-25] 24 (06/08 1210) BP: (125-146)/(78-91) 140/88 mmHg (06/08 1205) SpO2:  [99 %-100 %] 99 % (06/08 1210) Weight:  [203 lb (92.08 kg)] 203 lb (92.08 kg) (06/08 1033) Well-developed well-nourished patient in no acute distress. Alert and oriented x3 HEENT:within normal limits Cardiac: Regular rate and rhythm Pulmonary: Lungs clear to auscultation Abdomen: Soft and nontender.  Normal active bowel sounds  Musculoskeletal: (Right knee tender palpation over the medial joint line.  There is no instability.  There is trace effusion.  There is grinding and pain to range of motion. Labs: Recent Results (from the past 2160 hour(s))  APTT     Status: None   Collection Time    10/20/13 10:37 AM      Result Value Ref Range   aPTT 36  24 - 37 seconds  CBC WITH DIFFERENTIAL     Status: None   Collection Time    10/20/13 10:37 AM      Result Value Ref Range   WBC 7.9  4.0 - 10.5 K/uL   RBC 4.44  4.22 - 5.81 MIL/uL   Hemoglobin 14.6  13.0 - 17.0 g/dL   HCT 44.3  39.0 - 52.0 %   MCV 99.8  78.0 - 100.0 fL   MCH 32.9  26.0 - 34.0 pg   MCHC 33.0  30.0 - 36.0 g/dL   RDW 13.5  11.5 - 15.5 %    Platelets 246  150 - 400 K/uL   Neutrophils Relative % 57  43 - 77 %   Neutro Abs 4.5  1.7 - 7.7 K/uL   Lymphocytes Relative 28  12 - 46 %   Lymphs Abs 2.2  0.7 - 4.0 K/uL   Monocytes Relative 10  3 - 12 %   Monocytes Absolute 0.8  0.1 - 1.0 K/uL   Eosinophils Relative 4  0 - 5 %   Eosinophils Absolute 0.3  0.0 - 0.7 K/uL   Basophils Relative 1  0 - 1 %   Basophils Absolute 0.0  0.0 - 0.1 K/uL  COMPREHENSIVE METABOLIC PANEL     Status: Abnormal   Collection Time    10/20/13 10:37 AM      Result Value Ref Range   Sodium 141  137 - 147 mEq/L   Potassium 4.1  3.7 - 5.3 mEq/L   Chloride 103  96 - 112 mEq/L   CO2 28  19 - 32 mEq/L   Glucose, Bld 84  70 - 99 mg/dL   BUN 12  6 - 23 mg/dL   Creatinine, Ser 1.07  0.50 - 1.35 mg/dL   Calcium 9.3  8.4 - 10.5 mg/dL   Total Protein 6.7  6.0 - 8.3 g/dL   Albumin 3.3 (*) 3.5 - 5.2 g/dL   AST 29  0 - 37 U/L   ALT 37  0 - 53 U/L   Alkaline Phosphatase 56  39 - 117 U/L   Total Bilirubin 0.3  0.3 - 1.2 mg/dL   GFR calc non Af Amer 74 (*) >90 mL/min   GFR calc Af Amer 85 (*) >90 mL/min   Comment: (NOTE)     The eGFR has been calculated using the CKD EPI equation.     This calculation has not been validated in all clinical situations.     eGFR's persistently <90 mL/min signify possible Chronic Kidney     Disease.  PROTIME-INR     Status: None   Collection Time    10/20/13 10:37 AM      Result Value Ref Range   Prothrombin Time 12.2  11.6 - 15.2 seconds   INR 0.92  0.00 - 1.49  TYPE AND SCREEN     Status: None   Collection Time    10/20/13 10:40 AM      Result Value Ref Range   ABO/RH(D) A POS     Antibody Screen NEG     Sample Expiration 10/23/2013    ABO/RH     Status: None   Collection Time    10/20/13 10:40 AM      Result Value Ref Range  ABO/RH(D) A POS      Estimated body mass index is 27.53 kg/(m^2) as calculated from the following:   Height as of this encounter: 6' (1.829 m).   Weight as of this encounter: 203 lb (92.08  kg).   Imaging Review Plain radiographs demonstrate severe degenerative joint disease of the right knee(s). The overall alignment ismild varus. The bone quality appears to be good for age and reported activity level.  Assessment/Plan:  End stage arthritis, right knee   The patient history, physical examination, clinical judgment of the provider and imaging studies are consistent with end stage degenerative joint disease of the right knee(s) and total knee arthroplasty is deemed medically necessary. The treatment options including medical management, injection therapy arthroscopy and arthroplasty were discussed at length. The risks and benefits of total knee arthroplasty were presented and reviewed. The risks due to aseptic loosening, infection, stiffness, patella tracking problems, thromboembolic complications and other imponderables were discussed. The patient acknowledged the explanation, agreed to proceed with the plan and consent was signed. Patient is being admitted for inpatient treatment for surgery, pain control, PT, OT, prophylactic antibiotics, VTE prophylaxis, progressive ambulation and ADL's and discharge planning. The patient is planning to be discharged home with home health services

## 2013-10-20 NOTE — Anesthesia Postprocedure Evaluation (Signed)
  Anesthesia Post-op Note  Patient: Joseph Jarvis  Procedure(s) Performed: Procedure(s): TOTAL KNEE ARTHROPLASTY (Right)  Patient Location: PACU  Anesthesia Type:GA combined with regional for post-op pain  Level of Consciousness: awake, alert  and oriented  Airway and Oxygen Therapy: Patient Spontanous Breathing and Patient connected to nasal cannula oxygen  Post-op Pain: mild  Post-op Assessment: Post-op Vital signs reviewed  Post-op Vital Signs: Reviewed  Last Vitals:  Filed Vitals:   10/20/13 1537  BP: 133/83  Pulse: 91  Temp:   Resp: 18    Complications: No apparent anesthesia complications

## 2013-10-20 NOTE — Brief Op Note (Signed)
10/20/2013  2:36 PM  PATIENT:  Joseph Jarvis  58 y.o. male  PRE-OPERATIVE DIAGNOSIS:  DEGENERATOIVE JOINT DISEASE  POST-OPERATIVE DIAGNOSIS:  DEGENERATOIVE JOINT DISEASE  PROCEDURE:  Procedure(s): TOTAL KNEE ARTHROPLASTY (Right)  SURGEON:  Surgeon(s) and Role:    * Harvie Junior, MD - Primary  PHYSICIAN ASSISTANT:   ASSISTANTS: bethune   ANESTHESIA:   general  EBL:  Total I/O In: 1050 [I.V.:1050] Out: 50 [Blood:50]  BLOOD ADMINISTERED:none  DRAINS: (1) Hemovact drain(s) in the r. knee with  Suction Open   LOCAL MEDICATIONS USED:  OTHER experel  SPECIMEN:  No Specimen  DISPOSITION OF SPECIMEN:  N/A  COUNTS:  YES  TOURNIQUET:   Total Tourniquet Time Documented: Calf (Right) - 70 minutes Total: Calf (Right) - 70 minutes   DICTATION: .Other Dictation: Dictation Number [id number]  PLAN OF CARE: Admit to inpatient   PATIENT DISPOSITION:  PACU - hemodynamically stable.   Delay start of Pharmacological VTE agent (>24hrs) due to surgical blood loss or risk of bleeding: no

## 2013-10-20 NOTE — Transfer of Care (Signed)
Immediate Anesthesia Transfer of Care Note  Patient: Joseph Jarvis  Procedure(s) Performed: Procedure(s): TOTAL KNEE ARTHROPLASTY (Right)  Patient Location: PACU  Anesthesia Type:General  Level of Consciousness: awake and patient cooperative  Airway & Oxygen Therapy: Patient Spontanous Breathing and Patient connected to nasal cannula oxygen  Post-op Assessment: Report given to PACU RN, Post -op Vital signs reviewed and stable and Patient moving all extremities  Post vital signs: Reviewed and stable  Complications: No apparent anesthesia complications

## 2013-10-20 NOTE — Discharge Instructions (Signed)
Total Knee Replacement °Care After °Refer to this sheet in the next few weeks. These instructions provide you with information on caring for yourself after your procedure. Your caregiver also may give you specific instructions. Your treatment has been planned according to the most current medical practices, but problems sometimes occur. Call your caregiver if you have any problems or questions after your procedure. °HOME CARE INSTRUCTIONS  °· See a physical therapist as directed by your caregiver. °· Take over-the-counter or prescription medicines for pain, discomfort, or fever only as directed by your caregiver. °· Avoid lifting or driving until you are instructed otherwise. °· If you have been sent home with a continuous passive motion machine, use it as directed by your caregiver. °SEEK MEDICAL CARE IF: °· You have difficulty breathing. °· Your wound is red, swollen, or has become increasingly painful. °· You have pus draining from your wound. °· You have a bad smell coming from your wound. °· You have persistent bleeding from your wound. °· Your wound breaks open after sutures (stitches) or staples have been removed. °SEEK IMMEDIATE MEDICAL CARE IF:  °· You have a fever. °· You have a rash. °· You have pain or swelling in your calf or thigh. °· You have shortness of breath or chest pain. °· Your range of motion in your knee is decreasing rather than increasing. °MAKE SURE YOU:  °· Understand these instructions. °· Will watch your condition. °· Will get help right away if you are not doing well or get worse. °Document Released: 11/18/2004 Document Revised: 10/31/2011 Document Reviewed: 06/20/2011 °[employer]® Patient Information ©2014 [employer]. ° °

## 2013-10-21 ENCOUNTER — Encounter (HOSPITAL_COMMUNITY): Payer: Self-pay | Admitting: Orthopedic Surgery

## 2013-10-21 LAB — CBC
HCT: 38.2 % — ABNORMAL LOW (ref 39.0–52.0)
Hemoglobin: 12.8 g/dL — ABNORMAL LOW (ref 13.0–17.0)
MCH: 32.6 pg (ref 26.0–34.0)
MCHC: 33.5 g/dL (ref 30.0–36.0)
MCV: 97.2 fL (ref 78.0–100.0)
PLATELETS: 238 10*3/uL (ref 150–400)
RBC: 3.93 MIL/uL — ABNORMAL LOW (ref 4.22–5.81)
RDW: 13.2 % (ref 11.5–15.5)
WBC: 16.6 10*3/uL — AB (ref 4.0–10.5)

## 2013-10-21 LAB — BASIC METABOLIC PANEL
BUN: 11 mg/dL (ref 6–23)
CO2: 27 mEq/L (ref 19–32)
CREATININE: 1 mg/dL (ref 0.50–1.35)
Calcium: 8.5 mg/dL (ref 8.4–10.5)
Chloride: 98 mEq/L (ref 96–112)
GFR, EST NON AFRICAN AMERICAN: 80 mL/min — AB (ref >90)
Glucose, Bld: 150 mg/dL — ABNORMAL HIGH (ref 70–99)
POTASSIUM: 4.5 meq/L (ref 3.7–5.3)
Sodium: 137 mEq/L (ref 137–147)

## 2013-10-21 NOTE — Progress Notes (Signed)
Subjective: 1 Day Post-Op Procedure(s) (LRB): TOTAL KNEE ARTHROPLASTY (Right) Patient reports pain as mild.    Objective: Vital signs in last 24 hours: Temp:  [97.1 F (36.2 C)-98.9 F (37.2 C)] 98.3 F (36.8 C) (06/09 0539) Pulse Rate:  [73-96] 96 (06/09 0539) Resp:  [10-27] 16 (06/09 0539) BP: (124-156)/(78-98) 127/82 mmHg (06/09 0539) SpO2:  [91 %-100 %] 99 % (06/09 0539) Weight:  [203 lb (92.08 kg)] 203 lb (92.08 kg) (06/08 1033)  Intake/Output from previous day: 06/08 0701 - 06/09 0700 In: 1850 [I.V.:1850] Out: 1760 [Urine:1400; Drains:260; Blood:100] Intake/Output this shift:     Recent Labs  10/20/13 1037 10/21/13 0530  HGB 14.6 12.8*    Recent Labs  10/20/13 1037 10/21/13 0530  WBC 7.9 16.6*  RBC 4.44 3.93*  HCT 44.3 38.2*  PLT 246 238    Recent Labs  10/20/13 1037 10/21/13 0530  NA 141 137  K 4.1 4.5  CL 103 98  CO2 28 27  BUN 12 11  CREATININE 1.07 1.00  GLUCOSE 84 150*  CALCIUM 9.3 8.5    Recent Labs  10/20/13 1037  INR 0.92    Neurologically intact ABD soft Neurovascular intact Sensation intact distally Intact pulses distally Compartment soft  Assessment/Plan: 1 Day Post-Op Procedure(s) (LRB): TOTAL KNEE ARTHROPLASTY (Right) Advance diet Up with therapy Discharge home with home health either later today if continues top do well or tomorrow if issues arise  Harvie Junior 10/21/2013, 8:14 AM

## 2013-10-21 NOTE — Care Management Note (Signed)
CARE MANAGEMENT NOTE 10/21/2013  Patient:  Joseph Jarvis, Joseph Jarvis   Account Number:  [id number]  Date Initiated:  10/21/2013  Documentation initiated by:  Vance Peper  Subjective/Objective Assessment:   58 yr old male s/p right total Knee arthroplasty.     Action/Plan:   Case manager spoke with patient concerning home health and DME needs at discharge. Patient preoperatively setup with [facility], no changes. patient has family support at discharge.   Anticipated DC Date:  10/21/2013   Anticipated DC Plan:  HOME W HOME HEALTH SERVICES      DC Planning Services  CM consult      [facility] Choice  HOME HEALTH  DURABLE MEDICAL EQUIPMENT   Choice offered to / List presented to:  C-1 Patient   DME arranged  3-N-1  [facility]  CPM      DME agency  [employer]     HH arranged  HH-2 PT      HH agency  [facility]   Status of service:  Completed, signed off Medicare Important Message given?   (If response is "NO", the following Medicare IM given date fields will be blank) Date Medicare IM given:   Date Additional Medicare IM given:    Discharge Disposition:  HOME W HOME HEALTH SERVICES  Per UR Regulation:  Reviewed for med. necessity/level of care/duration of stay  If discussed at Long Length of Stay Meetings, dates discussed:    Comments:

## 2013-10-21 NOTE — Op Note (Signed)
NAMEHAYTHAM, VAIRO NO.:  [id number]  MEDICAL RECORD NO.:  [id number]  LOCATION:  5N11C                        FACILITY:  [facility]  PHYSICIAN:  Harvie Junior, M.D.   DATE OF BIRTH:  14-Feb-1956  DATE OF PROCEDURE:  10/20/2013 DATE OF DISCHARGE:                              OPERATIVE REPORT   PREOPERATIVE DIAGNOSIS:  End-stage degenerative joint disease, right knee.  POSTOPERATIVE DIAGNOSIS:  End-stage degenerative joint disease, right knee.  PRINCIPAL PROCEDURE:  Right total knee replacement with a Sigma system, size 4 femur, size 4 tibia, 10-mm bridging bearing, and a 41-mm all polyethylene patella.  SURGEON:  Harvie Junior, M.D.  ASSISTANT:  Marshia Ly, P.A.  ANESTHESIA:  General.  BRIEF HISTORY:  Mr. Character is a 58 year old male with long history of having significant complaints of right knee pain.  We have treated conservatively for long period of time.  We feel injection therapy, activity modification, and physical therapy, and after failure of all these modalities, he was taken to the operating room for right total knee replacement.  Preoperative x-ray showed bone-on-bone change and the patient was having significant night and light activity pain.  DESCRIPTION OF PROCEDURE:  The patient was taken to the operating room. After adequate anesthesia was obtained with general anesthetic, the patient was placed supine on the operating table where right leg was then prepped and draped in usual sterile fashion.  Following this, the leg was exsanguinated.  Blood pressure tourniquet was inflated to 350 mmHg.  Following this, a midline incision was made in the subcutaneous tissue, dissected down to the level of extensor mechanism and a medial parapatellar arthrotomy was undertaken.  Once this was completed, attention was turned to the right knee where the medial and lateral meniscus were removed, retropatellar fat pad, and synovium in the anterior  aspect of the femur, and the anterior and posterior cruciates. The tibia was exposed and cut perpendicular to its long axis with an extramedullary guide.  Once this was completed, attention was turned towards the femur with an intramedullary guide.  A 10-mm of distal femur was resected with a 5-degree valgus inclination.  Once this was completed, attention was turned towards sizing the femur, we sized to a 4.  Anterior and posterior cuts were made, chamfers and box.  Attention was turned to the tibia, size between the 4 and the 5.  We went down to 4, resected some bone around, which made it seemed actually more like almost a perfect fit.  It was drilled and keeled and trials were put in place, 10 trial was the appropriate size.  Attention was turned to the patella, cut down to a level of 16 and alternative 15 mm, took off 12 and then we trial with a 41 paddle.  Once the lugs were drilled for this and lugs drilled for the femur, trial components were all removed after a trial reduction was undertaken and noted to be satisfactory in all positions.  Once this was completed, attention was turned towards removal of the trial components as well as the patella and the knee was copiously and thoroughly lavaged, suctioned dry.  The final components were then cemented into  place, size 4 femur, size 4 tibia, 10-mm bearing trial was placed and a 41-mm all polyethylene patella.  Once the cement was allowed to completely harden and all excess bone cement had been removed, attention was turned towards removal of the trial component and the tourniquet was let down.  All bleeding was controlled with electrocautery.  60 mL of Exparel was instilled into the knee for postoperative anesthesia, and the final 10 poly was placed and the knee put through a range of motion, excellent stability, excellent full extension, and perfect stability in flexion and mid-flexion.  Medium Hemovac drain was placed.  The capsule  was then closed with 1 Vicryl running, skin with 0 and 2-0 Vicryl and 3-0 Monocryl subcuticular. Benzoin and Steri-Strips were applied.  Sterile compressive dressing was applied.  The patient was taken to the recovery room where he was noted to be in satisfactory condition.  Estimated blood loss for the procedure was none.     Harvie JuniorJohn L. Raelynn Corron, M.D.     Ranae PlumberJLG/MEDQ  D:  10/20/2013  T:  10/21/2013  Job:  [id number]

## 2013-10-21 NOTE — Evaluation (Signed)
Agree with SPT.    Tareva Leske, PT [phone]  

## 2013-10-21 NOTE — Plan of Care (Signed)
Problem: Consults Goal: Diagnosis- Total Joint Replacement Primary Total Knee     

## 2013-10-21 NOTE — Evaluation (Signed)
Physical Therapy Evaluation Patient Details Name: Joseph Jarvis W Jerde MRN: [id number] DOB: 12-23-1955 Today's Date: 10/21/2013   History of Present Illness  58 y.o. male s/p R TKA on 10/20/13  Clinical Impression  Pt required on supervision for sit to/from stand transfers and ambulation for VC for safety and use of RW.  Pt performed bed mobility mod-I with appropriate technique and no need for VC.  Pt ambulated 300 ft with RW and was able to ascend/descend one step using RW and backward approach to ascend with no difficulty after instruction.  Pt plans to d/c to home today and will need a RW and 3-in-1 for home use.  Pt will benefit from continued PT services for improving mobility and safety and increasing R LE strength and ROM.  Will continue to follow until pt is d/c from acute inpatient care.    Follow Up Recommendations Home health PT;Supervision for mobility/OOB    Equipment Recommendations  Rolling walker with 5" wheels;3in1 (PT)    Recommendations for Other Services       Precautions / Restrictions Precautions Precautions: Knee;Fall Precaution Booklet Issued: Yes (comment) Precaution Comments: Discussed precautions with patient and reviewed booklet. Required Braces or Orthoses: Knee Immobilizer - Right Restrictions Weight Bearing Restrictions: Yes      Mobility  Bed Mobility Overal bed mobility: Modified Independent             General bed mobility comments: Pt required no VC or physical assist for bed mobility.  Pt's technique was safe and appropriate.  Transfers Overall transfer level: Needs assistance Equipment used: Rolling walker (2 wheeled) Transfers: Sit to/from Stand Sit to Stand: Supervision         General transfer comment: Supervision provided for safety, pt attempts to stand without using RW for balance.  VC provided for use of RW once in standing.  Ambulation/Gait Ambulation/Gait assistance: Supervision Ambulation Distance (Feet): 300 Feet Assistive  device: Rolling walker (2 wheeled) Gait Pattern/deviations: Step-to pattern;Decreased stance time - right;Decreased step length - left;Decreased stride length     General Gait Details: Supervision for safety; pt attempts walking while carrying RW.  VC provided for safe use of RW.  No physical assist necessary.  Stairs Stairs: Yes Stairs assistance: Supervision Stair Management: No rails;Backwards;Forwards;With walker Number of Stairs: 1 General stair comments: Supervision provided for safety and VC for strategy with initial attempt.  Pt had no difficulties with use of RW to ascend step backwards.  Wheelchair Mobility    Modified Rankin (Stroke Patients Only)       Balance Overall balance assessment: Needs assistance Sitting-balance support: No upper extremity supported;Feet supported Sitting balance-Leahy Scale: Good Sitting balance - Comments: Pt able to shift weight in sitting beyond BOS with no UE support.   Standing balance support: No upper extremity supported;During functional activity Standing balance-Leahy Scale: Fair Standing balance comment: Pt demonstrates ambulation while carrying RW with no LOB but is still limited by decreased strength and ROM in R LE.                             Pertinent Vitals/Pain Pt reports 3/10 pain during therapy.  Pt premedicated.    Home Living Family/patient expects to be discharged to:: Private residence Living Arrangements: Non-relatives/Friends (Man who rents a room in pt's home can help 24/7, per pt) Available Help at Discharge: Family (Daughter lives nearby) Type of Home: House Home Access: Stairs to enter Entrance Stairs-Rails: None Entrance Stairs-Number of  Steps: 1 Home Layout: Two level;Able to live on main level with bedroom/bathroom Home Equipment: Gilmer Mor - quad      Prior Function Level of Independence: Independent               Hand Dominance        Extremity/Trunk Assessment   Upper Extremity  Assessment: Overall WFL for tasks assessed           Lower Extremity Assessment: RLE deficits/detail RLE Deficits / Details: Decreased ROM and strength, increased pain    Cervical / Trunk Assessment: Normal  Communication   Communication: No difficulties  Cognition Arousal/Alertness: Awake/alert Behavior During Therapy: WFL for tasks assessed/performed Overall Cognitive Status: Within Functional Limits for tasks assessed                      General Comments General comments (skin integrity, edema, etc.): Pt plans to go home this afternoon.    Exercises Total Joint Exercises Ankle Circles/Pumps: AROM;Both;10 reps;Seated Quad Sets: AROM;Right;10 reps;Seated      Assessment/Plan    PT Assessment Patient needs continued PT services  PT Diagnosis Abnormality of gait;Acute pain   PT Problem List Decreased strength;Decreased range of motion;Decreased activity tolerance;Decreased balance;Decreased mobility;Decreased knowledge of use of DME;Decreased safety awareness;Decreased knowledge of precautions;Pain  PT Treatment Interventions DME instruction;Gait training;Stair training;Therapeutic activities;Functional mobility training;Therapeutic exercise;Balance training;Patient/family education   PT Goals (Current goals can be found in the Care Plan section) Acute Rehab PT Goals Patient Stated Goal: Return home today PT Goal Formulation: With patient Time For Goal Achievement: 10/28/13 Potential to Achieve Goals: Good    Frequency 7X/week   Barriers to discharge        Co-evaluation               End of Session Equipment Utilized During Treatment: Gait belt;Right knee immobilizer Activity Tolerance: Patient tolerated treatment well Patient left: in chair;with call bell/phone within reach Nurse Communication: Mobility status         Time: 7793-9030 PT Time Calculation (min): 38 min   Charges:         PT G Codes:          Kiyanna Biegler,  SPT 10/21/2013, 9:56 AM

## 2013-10-21 NOTE — Progress Notes (Signed)
Agree with SPT.    Niguel Moure, PT [phone]  

## 2013-10-21 NOTE — Progress Notes (Signed)
Utilization review completed.  

## 2013-10-21 NOTE — Progress Notes (Signed)
Physical Therapy Treatment Patient Details Name: Joseph Jarvis MRN: [id number] DOB: [birth year] Today's Date: 10/21/2013    History of Present Illness 58 y.o. male s/p R TKA on 10/20/13    PT Comments    Pt demonstrated much improved safety awareness with use of RW during ambulation.  Pt tolerated seated exercises well and required no physical assistance at any time during this therapy session.  Pt okay to d/c to home from PT perspective.  Will continue to follow until pt is d/c from acute inpatient care.   Follow Up Recommendations  Home health PT;Supervision for mobility/OOB     Equipment Recommendations  Rolling walker with 5" wheels;3in1 (PT)    Recommendations for Other Services       Precautions / Restrictions Precautions Precautions: Knee;Fall Precaution Booklet Issued: Yes (comment) Precaution Comments: Discussed precautions with patient and reviewed booklet. Required Braces or Orthoses: Knee Immobilizer - Right Knee Immobilizer - Right: On when out of bed or walking;Discontinue once straight leg raise with < 10 degree lag Restrictions Weight Bearing Restrictions: Yes    Mobility  Bed Mobility Overal bed mobility: Modified Independent             General bed mobility comments: Pt demonstrates good bed mobility strategy.  Transfers Overall transfer level: Needs assistance Equipment used: Rolling walker (2 wheeled) Transfers: Sit to/from Stand Sit to Stand: Supervision         General transfer comment: Supervision provided for safety to ensure appropriate use of RW.  Ambulation/Gait Ambulation/Gait assistance: Modified independent (Device/Increase time) Ambulation Distance (Feet): 300 Feet Assistive device: Rolling walker (2 wheeled) Gait Pattern/deviations: Step-to pattern;Decreased stance time - right;Decreased step length - left;Decreased stride length     General Gait Details: Pt demonstrated safe use of RW during gait with no  LOB.   Stairs Stairs: Yes Stairs assistance: Supervision Stair Management: No rails;Backwards;Forwards;With walker Number of Stairs: 1 General stair comments: Supervision provided for safety and VC for strategy with initial attempt.  Pt had no difficulties with use of RW to ascend step backwards.  Wheelchair Mobility    Modified Rankin (Stroke Patients Only)       Balance Overall balance assessment: Needs assistance Sitting-balance support: No upper extremity supported;Feet supported Sitting balance-Leahy Scale: Good Sitting balance - Comments: Pt able to shift weight in sitting beyond BOS with no UE support.   Standing balance support: No upper extremity supported;During functional activity Standing balance-Leahy Scale: Fair Standing balance comment: Pt demonstrates ability to stand with no UE support while fixing gown, but would be unable to accept greater balance challenges due to decreaesed ROM in R LE.                    Cognition Arousal/Alertness: Awake/alert Behavior During Therapy: WFL for tasks assessed/performed Overall Cognitive Status: Within Functional Limits for tasks assessed                      Exercises Total Joint Exercises Ankle Circles/Pumps: AROM;Both;10 reps;Seated Quad Sets: AROM;Right;10 reps;Seated Straight Leg Raises: AROM;Right;10 reps;Seated Long Arc Quad: AROM;Right;10 reps;Seated Knee Flexion: AROM;Right;10 reps;Seated    General Comments General comments (skin integrity, edema, etc.): Pt plans to go home this afternoon.      Pertinent Vitals/Pain Pt reports that pain is currently well-controlled.  Pt premedicated.    Home Living Family/patient expects to be discharged to:: Private residence Living Arrangements: Non-relatives/Friends (Man who rents a room in pt's home can help 24/7, per pt)  Available Help at Discharge: Family (Daughter lives nearby) Type of Home: House Home Access: Stairs to enter Entrance  Stairs-Rails: None Home Layout: Two level;Able to live on main level with bedroom/bathroom Home Equipment: Cane - quad      Prior Function Level of Independence: Independent          PT Goals (current goals can now be found in the care plan section) Acute Rehab PT Goals Patient Stated Goal: Return home today PT Goal Formulation: With patient Time For Goal Achievement: 10/28/13 Potential to Achieve Goals: Good Progress towards PT goals: Progressing toward goals    Frequency  7X/week    PT Plan Current plan remains appropriate    Co-evaluation             End of Session Equipment Utilized During Treatment: Gait belt;Right knee immobilizer Activity Tolerance: Patient tolerated treatment well Patient left: in chair;with call bell/phone within reach     Time: [id number] PT Time Calculation (min): 24 min  Charges:  $Gait Training: 23-37 mins                    G Codes:      Amara Manalang, SPT 10/21/2013, 12:59 PM

## 2013-10-22 LAB — CBC
HCT: 35.6 % — ABNORMAL LOW (ref 39.0–52.0)
Hemoglobin: 11.8 g/dL — ABNORMAL LOW (ref 13.0–17.0)
MCH: 32.2 pg (ref 26.0–34.0)
MCHC: 33.1 g/dL (ref 30.0–36.0)
MCV: 97.3 fL (ref 78.0–100.0)
Platelets: 245 10*3/uL (ref 150–400)
RBC: 3.66 MIL/uL — ABNORMAL LOW (ref 4.22–5.81)
RDW: 13.2 % (ref 11.5–15.5)
WBC: 19.8 10*3/uL — ABNORMAL HIGH (ref 4.0–10.5)

## 2013-10-22 NOTE — Progress Notes (Signed)
Subjective: 2 Days Post-Op Procedure(s) (LRB): TOTAL KNEE ARTHROPLASTY (Right) Patient reports pain as 3 on 0-10 scale.  Good progress with PT  Objective: Vital signs in last 24 hours: Temp:  [97.4 F (36.3 C)-98.2 F (36.8 C)] 97.6 F (36.4 C) (06/10 0536) Pulse Rate:  [80-96] 80 (06/10 0536) Resp:  [14-18] 16 (06/10 0536) BP: (116-133)/(68-82) 133/82 mmHg (06/10 0536) SpO2:  [92 %-96 %] 96 % (06/10 0536)  Intake/Output from previous day: 06/09 0701 - 06/10 0700 In: 800 [P.O.:800] Out: 1650 [Urine:1650] Intake/Output this shift: Total I/O In: -  Out: 650 [Urine:650]   Recent Labs  10/20/13 1037 10/21/13 0530  HGB 14.6 12.8*    Recent Labs  10/20/13 1037 10/21/13 0530  WBC 7.9 16.6*  RBC 4.44 3.93*  HCT 44.3 38.2*  PLT 246 238    Recent Labs  10/20/13 1037 10/21/13 0530  NA 141 137  K 4.1 4.5  CL 103 98  CO2 28 27  BUN 12 11  CREATININE 1.07 1.00  GLUCOSE 84 150*  CALCIUM 9.3 8.5    Recent Labs  10/20/13 1037  INR 0.92  Right knee exam:  Neurovascular intact Sensation intact distally Intact pulses distally Dorsiflexion/Plantar flexion intact Incision: no drainage Compartment soft  Assessment/Plan: 2 Days Post-Op Procedure(s) (LRB): TOTAL KNEE ARTHROPLASTY (Right) Plan: Rx's given  F/U Dr Luiz Blare in 2 weeks. Discharge home with home health  Joseph Jarvis 10/22/2013, 8:27 AM

## 2013-10-22 NOTE — Discharge Summary (Signed)
Patient ID: Joseph Jarvis MRN: [id number] DOB/AGE: April 04, 1956 58 y.o.  Admit date: 10/20/2013 Discharge date: 10/22/2013  Admission Diagnoses:  Principal Problem:   Osteoarthritis of right knee   Discharge Diagnoses:  Same  Past Medical History  Diagnosis Date  . Panic attack   . Back pain   . GERD (gastroesophageal reflux disease)   . Arthritis     Surgeries: Procedure(s):Right TOTAL KNEE ARTHROPLASTY on 10/20/2013   Discharged Condition: Improved  Hospital Course: Joseph Jarvis is an 58 y.o. male who was admitted 10/20/2013 for operative treatment ofOsteoarthritis of right knee. Patient has severe unremitting pain that affects sleep, daily activities, and work/hobbies. After pre-op clearance the patient was taken to the operating room on 10/20/2013 and underwent  Procedure(s):Right TOTAL KNEE ARTHROPLASTY.    Patient was given perioperative antibiotics: Anti-infectives   Start     Dose/Rate Route Frequency Ordered Stop   10/20/13 1730  clindamycin (CLEOCIN) IVPB 600 mg     600 mg 100 mL/hr over 30 Minutes Intravenous Every 6 hours 10/20/13 1650 10/21/13 0121   10/20/13 0600  clindamycin (CLEOCIN) IVPB 900 mg     900 mg 100 mL/hr over 30 Minutes Intravenous On call to O.R. 10/19/13 1241 10/20/13 1252       Patient was given sequential compression devices, early ambulation, and chemoprophylaxis to prevent DVT.  Patient benefited maximally from hospital stay and there were no complications.    Recent vital signs: Patient Vitals for the past 24 hrs:  BP Temp Temp src Pulse Resp SpO2  10/22/13 0536 133/82 mmHg 97.6 F (36.4 C) Oral 80 16 96 %  10/21/13 2029 131/80 mmHg 97.4 F (36.3 C) Oral 89 14 92 %  10/21/13 1313 116/68 mmHg 98.2 F (36.8 C) Oral 96 18 96 %     Recent laboratory studies:  Recent Labs  10/20/13 1037 10/21/13 0530  WBC 7.9 16.6*  HGB 14.6 12.8*  HCT 44.3 38.2*  PLT 246 238  NA 141 137  K 4.1 4.5  CL 103 98  CO2 28 27  BUN 12 11   CREATININE 1.07 1.00  GLUCOSE 84 150*  INR 0.92  --   CALCIUM 9.3 8.5     Discharge Medications:     Medication List    STOP taking these medications       ibuprofen 200 MG tablet  Commonly known as:  ADVIL,MOTRIN      TAKE these medications       ALPRAZolam 1 MG tablet  Commonly known as:  XANAX  Take 1 mg by mouth 3 (three) times daily as needed for anxiety.     aspirin EC 325 MG tablet  Take 1 tablet (325 mg total) by mouth 2 (two) times daily after a meal.     methocarbamol 750 MG tablet  Commonly known as:  ROBAXIN-750  Take 1 tablet (750 mg total) by mouth every 8 (eight) hours as needed for muscle spasms.     oxyCODONE-acetaminophen 5-325 MG per tablet  Commonly known as:  PERCOCET/ROXICET  Take 1-2 tablets by mouth every 4 (four) hours as needed.     PARoxetine 40 MG tablet  Commonly known as:  PAXIL  Take 40 mg by mouth every morning.     traZODone 100 MG tablet  Commonly known as:  DESYREL  Take 100 mg by mouth at bedtime as needed for sleep.        Diagnostic Studies: Dg Chest 2 View  10/20/2013   CLINICAL  DATA:  57 year old male preoperative study for surgery. Initial encounter.  EXAM: CHEST  2 VIEW  COMPARISON:  [facility] chest radiographs 03/22/2013, and earlier.  FINDINGS: Normal lung volumes. Visualized tracheal air column is within normal limits. Small chronic calcified granuloma in the anterior chest on the lateral view, either the lingula or middle lobe.  On the frontal view in the right lower lung there is new nodular density in the periphery. Two discrete areas are identified, including a rounded area measuring 15 mm situated between the anterior lateral right fifth and sixth ribs, and a more rectangular area situated between the seventh and eighth ribs. This might relate 2 previous rib or chest wall trauma rather than lung nodule, but is new since 2014.  Left lung parenchyma is stable and clear. Normal cardiac size and mediastinal contours.  Visualized tracheal air column is within normal limits. No acute osseous abnormality identified.  IMPRESSION: 1. Two new right peripheral nodular opacities in the right lung since 2014. These might relate to prior chest wall trauma, but pulmonary nodule is not excluded and followup chest CT (IV contrast preferred) recommended. 2. No other acute cardiopulmonary abnormality identified.  These results will be called to the ordering clinician or representative by the Radiologist Assistant, and communication documented in the PACS or zVision Dashboard.   Electronically Signed   By: Augusto Gamble M.D.   On: 10/20/2013 11:29    Disposition: 06-Home-Health Care Svc      Discharge Instructions   CPM    Complete by:  As directed   Continuous passive motion machine (CPM):      Use the CPM from 0 degrees to70 degrees for 8 hours per day.      You may increase by 10 degrees per day.  You may break it up into 2 or 3 sessions per day.      Use CPM for 1-2 weeks or until you are told to stop.     Call MD / Call 911    Complete by:  As directed   If you experience chest pain or shortness of breath, CALL 911 and be transported to the hospital emergency room.  If you develope a fever above 101 F, pus (white drainage) or increased drainage or redness at the wound, or calf pain, call your surgeon's office.     Constipation Prevention    Complete by:  As directed   Drink plenty of fluids.  Prune juice may be helpful.  You may use a stool softener, such as Colace (over the counter) 100 mg twice a day.  Use MiraLax (over the counter) for constipation as needed.     Diet general    Complete by:  As directed      Do not put a pillow under the knee. Place it under the heel.    Complete by:  As directed      Increase activity slowly as tolerated    Complete by:  As directed      Weight bearing as tolerated    Complete by:  As directed            Follow-up Information   Follow up with GRAVES,JOHN L, MD. Schedule an  appointment as soon as possible for a visit in 2 weeks.   Specialty:  Orthopedic Surgery   Contact information:   [address] [place] [address] [phone]       Follow up with [facility]. (Someone from [facility]  will contact you concerning start date and time for physical therapy.)    Contact information:   [address] [place] [id number] [phone]        Signed: Matthew FolksBETHUNE,Jame Morrell G 10/22/2013, 8:29 AM

## 2015-07-03 ENCOUNTER — Emergency Department (HOSPITAL_COMMUNITY): Payer: Medicaid Other | Admitting: Certified Registered"

## 2015-07-03 ENCOUNTER — Inpatient Hospital Stay (HOSPITAL_COMMUNITY): Payer: Medicaid Other

## 2015-07-03 ENCOUNTER — Inpatient Hospital Stay (HOSPITAL_COMMUNITY)
Admission: EM | Admit: 2015-07-03 | Discharge: 2015-07-12 | DRG: 493 | Disposition: A | Discharge status: Home / Self Care | Payer: Medicaid Other | Attending: Orthopedic Surgery | Admitting: Orthopedic Surgery

## 2015-07-03 ENCOUNTER — Emergency Department (HOSPITAL_COMMUNITY): Payer: Medicaid Other

## 2015-07-03 ENCOUNTER — Encounter (HOSPITAL_COMMUNITY): Payer: Self-pay | Admitting: Emergency Medicine

## 2015-07-03 ENCOUNTER — Encounter (HOSPITAL_COMMUNITY)
Admission: EM | Disposition: A | Discharge status: Home / Self Care | Payer: Self-pay | Source: Home / Self Care | Attending: Orthopedic Surgery

## 2015-07-03 DIAGNOSIS — Z7982 Long term (current) use of aspirin: Secondary | ICD-10-CM

## 2015-07-03 DIAGNOSIS — S82141A Displaced bicondylar fracture of right tibia, initial encounter for closed fracture: Secondary | ICD-10-CM | POA: Diagnosis present

## 2015-07-03 DIAGNOSIS — K219 Gastro-esophageal reflux disease without esophagitis: Secondary | ICD-10-CM | POA: Diagnosis present

## 2015-07-03 DIAGNOSIS — Z79899 Other long term (current) drug therapy: Secondary | ICD-10-CM

## 2015-07-03 DIAGNOSIS — S82201A Unspecified fracture of shaft of right tibia, initial encounter for closed fracture: Secondary | ICD-10-CM | POA: Diagnosis present

## 2015-07-03 DIAGNOSIS — K5900 Constipation, unspecified: Secondary | ICD-10-CM | POA: Diagnosis not present

## 2015-07-03 DIAGNOSIS — W19XXXA Unspecified fall, initial encounter: Secondary | ICD-10-CM

## 2015-07-03 DIAGNOSIS — Y906 Blood alcohol level of 120-199 mg/100 ml: Secondary | ICD-10-CM | POA: Diagnosis present

## 2015-07-03 DIAGNOSIS — D62 Acute posthemorrhagic anemia: Secondary | ICD-10-CM | POA: Diagnosis not present

## 2015-07-03 DIAGNOSIS — Z96659 Presence of unspecified artificial knee joint: Secondary | ICD-10-CM

## 2015-07-03 DIAGNOSIS — Z419 Encounter for procedure for purposes other than remedying health state, unspecified: Secondary | ICD-10-CM

## 2015-07-03 DIAGNOSIS — S82831A Other fracture of upper and lower end of right fibula, initial encounter for closed fracture: Secondary | ICD-10-CM

## 2015-07-03 DIAGNOSIS — W1789XA Other fall from one level to another, initial encounter: Secondary | ICD-10-CM | POA: Diagnosis present

## 2015-07-03 DIAGNOSIS — M978XXA Periprosthetic fracture around other internal prosthetic joint, initial encounter: Secondary | ICD-10-CM

## 2015-07-03 DIAGNOSIS — T79A21A Traumatic compartment syndrome of right lower extremity, initial encounter: Secondary | ICD-10-CM | POA: Diagnosis present

## 2015-07-03 DIAGNOSIS — S82143A Displaced bicondylar fracture of unspecified tibia, initial encounter for closed fracture: Secondary | ICD-10-CM

## 2015-07-03 DIAGNOSIS — F17210 Nicotine dependence, cigarettes, uncomplicated: Secondary | ICD-10-CM | POA: Diagnosis present

## 2015-07-03 DIAGNOSIS — M9711XA Periprosthetic fracture around internal prosthetic right knee joint, initial encounter: Principal | ICD-10-CM | POA: Diagnosis present

## 2015-07-03 DIAGNOSIS — I10 Essential (primary) hypertension: Secondary | ICD-10-CM | POA: Diagnosis present

## 2015-07-03 DIAGNOSIS — F10129 Alcohol abuse with intoxication, unspecified: Secondary | ICD-10-CM | POA: Diagnosis present

## 2015-07-03 DIAGNOSIS — S82401A Unspecified fracture of shaft of right fibula, initial encounter for closed fracture: Secondary | ICD-10-CM

## 2015-07-03 DIAGNOSIS — Z880 Allergy status to penicillin: Secondary | ICD-10-CM | POA: Diagnosis not present

## 2015-07-03 DIAGNOSIS — S82101A Unspecified fracture of upper end of right tibia, initial encounter for closed fracture: Secondary | ICD-10-CM | POA: Insufficient documentation

## 2015-07-03 HISTORY — DX: Presence of unspecified artificial knee joint: Z96.659

## 2015-07-03 HISTORY — DX: Traumatic compartment syndrome of right lower extremity, initial encounter: T79.A21A

## 2015-07-03 HISTORY — PX: DORSAL COMPARTMENT RELEASE: SHX5039

## 2015-07-03 HISTORY — DX: Periprosthetic fracture around other internal prosthetic joint, initial encounter: M97.8XXA

## 2015-07-03 HISTORY — DX: Essential (primary) hypertension: I10

## 2015-07-03 HISTORY — PX: ORIF TIBIA FRACTURE: SHX5416

## 2015-07-03 LAB — CBC WITH DIFFERENTIAL/PLATELET
Basophils Absolute: 0.1 10*3/uL (ref 0.0–0.1)
Basophils Relative: 1 %
EOS ABS: 0.3 10*3/uL (ref 0.0–0.7)
Eosinophils Relative: 3 %
HEMATOCRIT: 42.3 % (ref 39.0–52.0)
HEMOGLOBIN: 14.2 g/dL (ref 13.0–17.0)
LYMPHS ABS: 1.9 10*3/uL (ref 0.7–4.0)
Lymphocytes Relative: 17 %
MCH: 32.7 pg (ref 26.0–34.0)
MCHC: 33.6 g/dL (ref 30.0–36.0)
MCV: 97.5 fL (ref 78.0–100.0)
Monocytes Absolute: 1 10*3/uL (ref 0.1–1.0)
Monocytes Relative: 9 %
NEUTROS ABS: 8.2 10*3/uL — AB (ref 1.7–7.7)
NEUTROS PCT: 70 %
Platelets: 263 10*3/uL (ref 150–400)
RBC: 4.34 MIL/uL (ref 4.22–5.81)
RDW: 13.8 % (ref 11.5–15.5)
WBC: 11.5 10*3/uL — AB (ref 4.0–10.5)

## 2015-07-03 LAB — COMPREHENSIVE METABOLIC PANEL
ALT: 51 U/L (ref 17–63)
AST: 91 U/L — ABNORMAL HIGH (ref 15–41)
Albumin: 3.7 g/dL (ref 3.5–5.0)
Alkaline Phosphatase: 48 U/L (ref 38–126)
Anion gap: 10 (ref 5–15)
BUN: 7 mg/dL (ref 6–20)
CHLORIDE: 106 mmol/L (ref 101–111)
CO2: 25 mmol/L (ref 22–32)
Calcium: 8.9 mg/dL (ref 8.9–10.3)
Creatinine, Ser: 1.08 mg/dL (ref 0.61–1.24)
GFR calc Af Amer: >60 mL/min (ref >60)
GFR calc non Af Amer: >60 mL/min (ref >60)
Glucose, Bld: 91 mg/dL (ref 65–99)
POTASSIUM: 4.9 mmol/L (ref 3.5–5.1)
SODIUM: 141 mmol/L (ref 135–145)
Total Bilirubin: 0.9 mg/dL (ref 0.3–1.2)
Total Protein: 6.7 g/dL (ref 6.5–8.1)

## 2015-07-03 LAB — ETHANOL: Alcohol, Ethyl (B): 179 mg/dL — ABNORMAL HIGH (ref <5)

## 2015-07-03 SURGERY — RELEASE, FIRST DORSAL COMPARTMENT, HAND
Anesthesia: General | Site: Leg Lower | Laterality: Right

## 2015-07-03 MED ORDER — LIDOCAINE HCL (CARDIAC) 20 MG/ML IV SOLN
INTRAVENOUS | Status: AC
  Filled 2015-07-03: qty 10

## 2015-07-03 MED ORDER — SODIUM CHLORIDE 0.9 % IV SOLN
INTRAVENOUS | Status: DC
  Administered 2015-07-04 – 2015-07-07 (×4): via INTRAVENOUS

## 2015-07-03 MED ORDER — PROPOFOL 10 MG/ML IV BOLUS
INTRAVENOUS | Status: AC
  Filled 2015-07-03: qty 20

## 2015-07-03 MED ORDER — LIDOCAINE HCL (CARDIAC) 20 MG/ML IV SOLN
INTRAVENOUS | Status: AC
  Filled 2015-07-03: qty 5

## 2015-07-03 MED ORDER — LACTATED RINGERS IV SOLN
INTRAVENOUS | Status: DC | PRN
  Administered 2015-07-03 (×2): via INTRAVENOUS

## 2015-07-03 MED ORDER — PHENYLEPHRINE 40 MCG/ML (10ML) SYRINGE FOR IV PUSH (FOR BLOOD PRESSURE SUPPORT)
PREFILLED_SYRINGE | INTRAVENOUS | Status: AC
  Filled 2015-07-03: qty 10

## 2015-07-03 MED ORDER — SUCCINYLCHOLINE CHLORIDE 20 MG/ML IJ SOLN
INTRAMUSCULAR | Status: AC
  Filled 2015-07-03: qty 2

## 2015-07-03 MED ORDER — HYDROMORPHONE HCL 1 MG/ML IJ SOLN
INTRAMUSCULAR | Status: AC
  Filled 2015-07-03: qty 1

## 2015-07-03 MED ORDER — TETANUS-DIPHTH-ACELL PERTUSSIS 5-2.5-18.5 LF-MCG/0.5 IM SUSP
0.5000 mL | Freq: Once | INTRAMUSCULAR | Status: AC
  Administered 2015-07-03: 0.5 mL via INTRAMUSCULAR
  Filled 2015-07-03: qty 0.5

## 2015-07-03 MED ORDER — ALBUTEROL SULFATE HFA 108 (90 BASE) MCG/ACT IN AERS
INHALATION_SPRAY | RESPIRATORY_TRACT | Status: DC | PRN
  Administered 2015-07-03: 4 via RESPIRATORY_TRACT

## 2015-07-03 MED ORDER — FENTANYL CITRATE (PF) 100 MCG/2ML IJ SOLN
INTRAMUSCULAR | Status: DC | PRN
  Administered 2015-07-03: 50 ug via INTRAVENOUS
  Administered 2015-07-03: 100 ug via INTRAVENOUS
  Administered 2015-07-03 (×2): 50 ug via INTRAVENOUS

## 2015-07-03 MED ORDER — SUCCINYLCHOLINE CHLORIDE 20 MG/ML IJ SOLN
INTRAMUSCULAR | Status: DC | PRN
  Administered 2015-07-03: 120 mg via INTRAVENOUS

## 2015-07-03 MED ORDER — STERILE WATER FOR INJECTION IJ SOLN
INTRAMUSCULAR | Status: AC
  Filled 2015-07-03: qty 10

## 2015-07-03 MED ORDER — CLINDAMYCIN PHOSPHATE 900 MG/50ML IV SOLN
900.0000 mg | Freq: Once | INTRAVENOUS | Status: AC
Start: 2015-07-03 — End: 2015-07-03
  Administered 2015-07-03: 900 mg via INTRAVENOUS
  Filled 2015-07-03: qty 50

## 2015-07-03 MED ORDER — ONDANSETRON HCL 4 MG/2ML IJ SOLN
INTRAMUSCULAR | Status: AC
  Filled 2015-07-03: qty 2

## 2015-07-03 MED ORDER — MORPHINE SULFATE (PF) 4 MG/ML IV SOLN
4.0000 mg | Freq: Once | INTRAVENOUS | Status: AC
  Administered 2015-07-03: 4 mg via INTRAVENOUS
  Filled 2015-07-03: qty 1

## 2015-07-03 MED ORDER — CLINDAMYCIN PHOSPHATE 600 MG/50ML IV SOLN
600.0000 mg | Freq: Four times a day (QID) | INTRAVENOUS | Status: AC
  Administered 2015-07-04 (×3): 600 mg via INTRAVENOUS
  Filled 2015-07-03 (×3): qty 50

## 2015-07-03 MED ORDER — FENTANYL CITRATE (PF) 250 MCG/5ML IJ SOLN
INTRAMUSCULAR | Status: AC
  Filled 2015-07-03: qty 5

## 2015-07-03 MED ORDER — ONDANSETRON HCL 4 MG/2ML IJ SOLN
INTRAMUSCULAR | Status: DC | PRN
  Administered 2015-07-03: 4 mg via INTRAVENOUS

## 2015-07-03 MED ORDER — EPHEDRINE SULFATE 50 MG/ML IJ SOLN
INTRAMUSCULAR | Status: DC | PRN
  Administered 2015-07-03: 10 mg via INTRAVENOUS
  Administered 2015-07-03: 5 mg via INTRAVENOUS
  Administered 2015-07-03: 10 mg via INTRAVENOUS

## 2015-07-03 MED ORDER — HYDROMORPHONE HCL 1 MG/ML IJ SOLN
0.2500 mg | INTRAMUSCULAR | Status: DC | PRN
  Administered 2015-07-03 (×4): 0.5 mg via INTRAVENOUS

## 2015-07-03 MED ORDER — PHENYLEPHRINE HCL 10 MG/ML IJ SOLN
INTRAMUSCULAR | Status: DC | PRN
  Administered 2015-07-03 (×2): 80 ug via INTRAVENOUS
  Administered 2015-07-03: 40 ug via INTRAVENOUS
  Administered 2015-07-03: 80 ug via INTRAVENOUS

## 2015-07-03 MED ORDER — EPHEDRINE SULFATE 50 MG/ML IJ SOLN
INTRAMUSCULAR | Status: AC
  Filled 2015-07-03: qty 1

## 2015-07-03 MED ORDER — PROMETHAZINE HCL 25 MG/ML IJ SOLN
6.2500 mg | INTRAMUSCULAR | Status: DC | PRN
Start: 2015-07-03 — End: 2015-07-03

## 2015-07-03 MED ORDER — PROPOFOL 10 MG/ML IV BOLUS
INTRAVENOUS | Status: DC | PRN
  Administered 2015-07-03: 200 mg via INTRAVENOUS

## 2015-07-03 MED ORDER — LIDOCAINE HCL (CARDIAC) 20 MG/ML IV SOLN
INTRAVENOUS | Status: DC | PRN
Start: 2015-07-03 — End: 2015-07-03
  Administered 2015-07-03: 60 mg via INTRAVENOUS

## 2015-07-03 SURGICAL SUPPLY — 46 items
BANDAGE ACE 4X5 VEL STRL LF (GAUZE/BANDAGES/DRESSINGS) ×1 IMPLANT
BANDAGE ACE 6X5 VEL STRL LF (GAUZE/BANDAGES/DRESSINGS) ×1 IMPLANT
BAR EXFX 500X11 NS LF (MISCELLANEOUS) ×2
BAR GLASS FIBER EXFX 11X500 (MISCELLANEOUS) ×2 IMPLANT
BNDG GAUZE ELAST 4 BULKY (GAUZE/BANDAGES/DRESSINGS) ×1 IMPLANT
COVER SURGICAL LIGHT HANDLE (MISCELLANEOUS) ×2 IMPLANT
DRAPE C-ARMOR (DRAPES) ×1 IMPLANT
DRAPE INCISE IOBAN 66X45 STRL (DRAPES) ×3 IMPLANT
DRAPE ORTHO SPLIT 77X108 STRL (DRAPES) ×4
DRAPE SURG ORHT 6 SPLT 77X108 (DRAPES) ×2 IMPLANT
DRSG ADAPTIC 3X8 NADH LF (GAUZE/BANDAGES/DRESSINGS) ×1 IMPLANT
DRSG PAD ABDOMINAL 8X10 ST (GAUZE/BANDAGES/DRESSINGS) ×1 IMPLANT
DRSG VAC ATS MED SENSATRAC (GAUZE/BANDAGES/DRESSINGS) ×1 IMPLANT
DURAPREP 26ML APPLICATOR (WOUND CARE) ×2 IMPLANT
ELECT REM PT RETURN 9FT ADLT (ELECTROSURGICAL)
ELECTRODE REM PT RTRN 9FT ADLT (ELECTROSURGICAL) IMPLANT
GAUZE SPONGE 4X4 12PLY STRL (GAUZE/BANDAGES/DRESSINGS) ×2 IMPLANT
GAUZE XEROFORM 1X8 LF (GAUZE/BANDAGES/DRESSINGS) ×1 IMPLANT
GAUZE XEROFORM 5X9 LF (GAUZE/BANDAGES/DRESSINGS) ×2 IMPLANT
GLOVE BIO SURGEON STRL SZ8 (GLOVE) ×2 IMPLANT
GLOVE BIOGEL PI IND STRL 8 (GLOVE) IMPLANT
GLOVE BIOGEL PI INDICATOR 8 (GLOVE) ×1
GLOVE ORTHO TXT STRL SZ7.5 (GLOVE) ×2 IMPLANT
GOWN STRL REUS W/ TWL LRG LVL3 (GOWN DISPOSABLE) ×1 IMPLANT
GOWN STRL REUS W/ TWL XL LVL3 (GOWN DISPOSABLE) ×4 IMPLANT
GOWN STRL REUS W/TWL LRG LVL3 (GOWN DISPOSABLE) ×2
GOWN STRL REUS W/TWL XL LVL3 (GOWN DISPOSABLE) ×8
HALF-PIN 5MM X 200 X 45 ×2 IMPLANT
KIT BASIN OR (CUSTOM PROCEDURE TRAY) ×2 IMPLANT
KIT ROOM TURNOVER OR (KITS) ×2 IMPLANT
MANIFOLD NEPTUNE II (INSTRUMENTS) ×2 IMPLANT
NS IRRIG 1000ML POUR BTL (IV SOLUTION) ×2 IMPLANT
PACK ORTHO EXTREMITY (CUSTOM PROCEDURE TRAY) ×2 IMPLANT
PAD ARMBOARD 7.5X6 YLW CONV (MISCELLANEOUS) ×4 IMPLANT
PADDING CAST COTTON 6X4 STRL (CAST SUPPLIES) ×2 IMPLANT
PIN CLAMP 2BAR 75MM BLUE (PIN) ×2 IMPLANT
PIN HALF ORANGE 5X200X45MM (Pin) ×2 IMPLANT
PIN HALF YELLOW 5X160X35 (PIN) ×2 IMPLANT
SPONGE LAP 18X18 X RAY DECT (DISPOSABLE) ×2 IMPLANT
SUT ETHILON 2 0 FS 18 (SUTURE) IMPLANT
SUT VIC AB 2-0 CT1 27 (SUTURE)
SUT VIC AB 2-0 CT1 TAPERPNT 27 (SUTURE) IMPLANT
TOWEL OR 17X26 10 PK STRL BLUE (TOWEL DISPOSABLE) ×2 IMPLANT
TUBE CONNECTING 12X1/4 (SUCTIONS) ×1 IMPLANT
UNDERPAD 30X30 INCONTINENT (UNDERPADS AND DIAPERS) ×3 IMPLANT
YANKAUER SUCT BULB TIP NO VENT (SUCTIONS) ×1 IMPLANT

## 2015-07-03 NOTE — H&P (Signed)
Joseph Jarvis is an 60 y.o. male.   Chief Complaint:   Right leg pain and swelling with known tibia/fibula fracture HPI:   60 yo male with a history of a right total knee replacement by Dr. Berenice Primas in 2015 fell this evening off of a porch landing awkwardly on his right leg.  Was brought to the ED and found to have a severely comminuted proximal tibia fracture.  Ortho was consulted, and on exam, I noted him to have active compartment syndrome of his right leg.  Emergent surgery has been recommended.  He does report severe right leg pain.  He has also been drinking EtOH considerably today as well.  Past Medical History  Diagnosis Date  . Panic attack   . Back pain   . GERD (gastroesophageal reflux disease)   . Arthritis     Past Surgical History  Procedure Laterality Date  . No past surgeries    . Total knee arthroplasty Right 10/20/2013    Procedure: TOTAL KNEE ARTHROPLASTY;  Surgeon: Alta Corning, MD;  Location: [facility];  Service: Orthopedics;  Laterality: Right;    History reviewed. No pertinent family history. Social History:  reports that he has been smoking Cigarettes.  He has a 30 pack-year smoking history. He does not have any smokeless tobacco history on file. He reports that he drinks alcohol. He reports that he does not use illicit drugs.  Allergies:  Allergies  Allergen Reactions  . Penicillins Swelling     (Not in a hospital admission)  Results for orders placed or performed during the hospital encounter of 07/03/15 (from the past 48 hour(s))  CBC with Differential/Platelet     Status: Abnormal   Collection Time: 07/03/15  6:18 PM  Result Value Ref Range   WBC 11.5 (H) 4.0 - 10.5 K/uL   RBC 4.34 4.22 - 5.81 MIL/uL   Hemoglobin 14.2 13.0 - 17.0 g/dL   HCT 42.3 39.0 - 52.0 %   MCV 97.5 78.0 - 100.0 fL   MCH 32.7 26.0 - 34.0 pg   MCHC 33.6 30.0 - 36.0 g/dL   RDW 13.8 11.5 - 15.5 %   Platelets 263 150 - 400 K/uL   Neutrophils Relative % 70 %   Neutro Abs 8.2 (H) 1.7  - 7.7 K/uL   Lymphocytes Relative 17 %   Lymphs Abs 1.9 0.7 - 4.0 K/uL   Monocytes Relative 9 %   Monocytes Absolute 1.0 0.1 - 1.0 K/uL   Eosinophils Relative 3 %   Eosinophils Absolute 0.3 0.0 - 0.7 K/uL   Basophils Relative 1 %   Basophils Absolute 0.1 0.0 - 0.1 K/uL  Comprehensive metabolic panel     Status: Abnormal   Collection Time: 07/03/15  6:18 PM  Result Value Ref Range   Sodium 141 135 - 145 mmol/L   Potassium 4.9 3.5 - 5.1 mmol/L   Chloride 106 101 - 111 mmol/L   CO2 25 22 - 32 mmol/L   Glucose, Bld 91 65 - 99 mg/dL   BUN 7 6 - 20 mg/dL   Creatinine, Ser 1.08 0.61 - 1.24 mg/dL   Calcium 8.9 8.9 - 10.3 mg/dL   Total Protein 6.7 6.5 - 8.1 g/dL   Albumin 3.7 3.5 - 5.0 g/dL   AST 91 (H) 15 - 41 U/L   ALT 51 17 - 63 U/L   Alkaline Phosphatase 48 38 - 126 U/L   Total Bilirubin 0.9 0.3 - 1.2 mg/dL   GFR calc  non Af Amer >60 >60 mL/min   GFR calc Af Amer >60 >60 mL/min    Comment: (NOTE) The eGFR has been calculated using the CKD EPI equation. This calculation has not been validated in all clinical situations. eGFR's persistently <60 mL/min signify possible Chronic Kidney Disease.    Anion gap 10 5 - 15  Ethanol     Status: Abnormal   Collection Time: 07/03/15  6:18 PM  Result Value Ref Range   Alcohol, Ethyl (B) 179 (H) <5 mg/dL    Comment:        LOWEST DETECTABLE LIMIT FOR SERUM ALCOHOL IS 5 mg/dL FOR MEDICAL PURPOSES ONLY    Dg Knee 1-2 Views Right  07/03/2015  CLINICAL DATA:  Fall off porch earlier today, now with right leg pain and swelling. Initial encounter. EXAM: RIGHT KNEE - 1-2 VIEW COMPARISON:  None. FINDINGS: Right total knee arthroplasty in place. Comminuted displaced proximal tibial fracture involving the meta diaphysis. Fracture extends through the anterior medial aspect of the tibial stem. No definite involvement of the tibial tray. There is 7 mm displacement of the anterior dominant fracture fragment. Comminuted displaced proximal fibular fracture  involving the fibular neck. No extension to the fibular head. Small knee joint effusion. IMPRESSION: Comminuted displaced proximal tibia and fibular fractures. Proximal tibia fracture extends to the anterior medial aspect of the tibial stem of the total knee arthroplasty. Electronically Signed   By: Jeb Levering M.D.   On: 07/03/2015 19:20   Dg Tibia/fibula Right  07/03/2015  CLINICAL DATA:  Fall off porch today, pain and swelling of anterior lower leg. Initial encounter. EXAM: RIGHT TIBIA AND FIBULA - 2 VIEW COMPARISON:  None. FINDINGS: Right knee arthroplasty in place. Comminuted displaced proximal tibial fracture involving the meta diaphysis. Fracture extends through the anterior medial aspect of the tibial stem. No definite involvement of the tibial tray. There is 7 mm displacement of the anterior dominant fracture fragment, and 14 mm distraction of the medial fracture component. Comminuted displaced proximal fibular fracture involving the fibular neck. The distal tibia and fibula are intact. There is diffuse soft tissue edema. IMPRESSION: Comminuted displaced proximal tibia and fibula fractures. Proximal tibia fracture extends to the tibial stem of right knee arthroplasty. Electronically Signed   By: Jeb Levering M.D.   On: 07/03/2015 19:22   Dg Ankle 2 Views Right  07/03/2015  CLINICAL DATA:  Fall off porch earlier today. Now with right lower leg pain and swelling. Initial encounter. EXAM: RIGHT ANKLE - 2 VIEW COMPARISON:  None. FINDINGS: No fracture or dislocation. The alignment and joint spaces are maintained. Ankle mortise is preserved. Lateral soft tissue edema is noted. IMPRESSION: No fracture or subluxation of the right ankle. Electronically Signed   By: Jeb Levering M.D.   On: 07/03/2015 19:23    ROS  Blood pressure 147/100, pulse 104, temperature 98.4 F (36.9 C), temperature source Oral, resp. rate 16, SpO2 95 %. Physical Exam  Constitutional: He is oriented to person, place,  and time. He appears well-developed and well-nourished.  HENT:  Head: Normocephalic and atraumatic.  Eyes: Conjunctivae are normal. Pupils are equal, round, and reactive to light.  Neck: Normal range of motion. Neck supple.  Cardiovascular: Regular rhythm.  Tachycardia present.   Respiratory: Effort normal and breath sounds normal.  GI: Soft. Bowel sounds are normal.  Musculoskeletal:       Right lower leg: He exhibits tenderness, swelling, edema and deformity.  Neurological: He is alert and oriented to person,  place, and time.    His right leg compartments are all considerably tight and he has decreased sensation around his foot.  There is severe pain in the right leg on passive stretch.  His foot is well-perfused.   Assessment/Plan Right proximal tibia/fibula periprosthetic fracture distal to a total knee replacement; active compartment syndrome right leg 1)  To the OR emergently for fasciotomies of the right leg as well as external fixation spanning the knee to stabiilze the fracture.  He understands this fully and informed consent is obtained.  Mcarthur Rossetti, MD 07/03/2015, 8:59 PM

## 2015-07-03 NOTE — Anesthesia Preprocedure Evaluation (Addendum)
Anesthesia Evaluation  Patient identified by MRN, date of birth, ID band Patient awake    Reviewed: Allergy & Precautions, NPO status , Patient's Chart, lab work & pertinent test results  History of Anesthesia Complications Negative for: history of anesthetic complications  Airway Mallampati: II  TM Distance: >3 FB Neck ROM: Full    Dental  (+) Edentulous Upper, Edentulous Lower   Pulmonary Current Smoker,    breath sounds clear to auscultation       Cardiovascular negative cardio ROS   Rhythm:Regular Rate:Normal     Neuro/Psych PSYCHIATRIC DISORDERS Anxiety    GI/Hepatic Neg liver ROS, GERD  ,  Endo/Other  negative endocrine ROS  Renal/GU negative Renal ROS     Musculoskeletal  (+) Arthritis ,   Abdominal   Peds  Hematology   Anesthesia Other Findings   Reproductive/Obstetrics                           Anesthesia Physical Anesthesia Plan  ASA: II and emergent  Anesthesia Plan: General   Post-op Pain Management:    Induction: Intravenous, Rapid sequence and Cricoid pressure planned  Airway Management Planned: Oral ETT  Additional Equipment:   Intra-op Plan:   Post-operative Plan: Extubation in OR  Informed Consent: I have reviewed the patients History and Physical, chart, labs and discussed the procedure including the risks, benefits and alternatives for the proposed anesthesia with the patient or authorized representative who has indicated his/her understanding and acceptance.   Dental advisory given  Plan Discussed with: Anesthesiologist and Surgeon  Anesthesia Plan Comments:        Anesthesia Quick Evaluation

## 2015-07-03 NOTE — ED Notes (Signed)
Magnus Ivan MD exits room, states pt needs to be taken to OR, pt developing compartment syndrome. States that OR is already aware. Belonging with this RN and will be taken to pt once out of surgery

## 2015-07-03 NOTE — Anesthesia Postprocedure Evaluation (Signed)
Anesthesia Post Note  Patient: Joseph Jarvis  Procedure(s) Performed: Procedure(s) (LRB): COMPARTMENT SYNDROME RELEASE RIGHT LOWER LEG, APPLICATION OF LARGE EXTERNAL FIXATOR (Right)  Patient location during evaluation: PACU Anesthesia Type: General Level of consciousness: awake Pain management: pain level controlled Respiratory status: spontaneous breathing Cardiovascular status: stable Anesthetic complications: no    Last Vitals:  Filed Vitals:   07/03/15 2300 07/03/15 2310  BP: 131/67   Pulse: 115 115  Temp:    Resp: 15 15    Last Pain:  Filed Vitals:   07/03/15 2315  PainSc: 8                  EDWARDS,Lerae Langham

## 2015-07-03 NOTE — ED Notes (Signed)
Patient transported to X-ray 

## 2015-07-03 NOTE — Anesthesia Procedure Notes (Signed)
Procedure Name: Intubation Date/Time: 07/03/2015 9:34 PM Performed by: Arlice Colt B Pre-anesthesia Checklist: Patient identified, Emergency Drugs available, Suction available, Patient being monitored and Timeout performed Patient Re-evaluated:Patient Re-evaluated prior to inductionOxygen Delivery Method: Circle system utilized Preoxygenation: Pre-oxygenation with 100% oxygen Intubation Type: IV induction, Rapid sequence and Cricoid Pressure applied Laryngoscope Size: Mac and 3 Grade View: Grade I Tube type: Oral Tube size: 7.5 mm Number of attempts: 1 Airway Equipment and Method: Stylet Placement Confirmation: ETT inserted through vocal cords under direct vision,  positive ETCO2 and breath sounds checked- equal and bilateral Secured at: 22 cm Tube secured with: Tape Dental Injury: Teeth and Oropharynx as per pre-operative assessment

## 2015-07-03 NOTE — Op Note (Signed)
NAMEFREAD, KOTTKE NO.:  [id number]  MEDICAL RECORD NO.:  [id number]  LOCATION:  [facility]                         FACILITY:  [facility]  PHYSICIAN:  Vanita Panda. Magnus Ivan, M.D.DATE OF BIRTH:  [birth year]  DATE OF PROCEDURE:  07/03/2015 DATE OF DISCHARGE:                              OPERATIVE REPORT   PREOPERATIVE DIAGNOSES: 1. Right complex proximal tibia and tibia shaft periprosthetic     fracture. 2. Compartment syndrome, right lower extremity.  POSTOPERATIVE DIAGNOSES: 1. Right complex proximal tibia and tibia shaft periprosthetic     fracture. 2. Compartment syndrome, right lower extremity.  PROCEDURES: 1. Four-compartment fasciotomies, right lower extremity. 2. External fixation spanning, the right knee. 3. Placement of dicubo-VAC sponge, right medial and lateral fasciotomy     wounds.  SURGEON:  Vanita Panda. Magnus Ivan, M.D.  ANESTHESIA:  General.  ANTIBIOTICS:  900 mg of IV clindamycin.  BLOOD LOSS:  Less than 200 mL.  COMPLICATIONS:  None.  INDICATIONS:  Mr. Joseph Jarvis is a 60 year old gentleman, who was drinking earlier this evening when he sustained a mechanical fall of his porch. He had its obvious deformity of his right leg and was brought to the [facility] Emergency Room.  He was found to have a complex proximal-to- midshaft right tibia fracture and this was just distal to a right total knee arthroplasty.  The knee arthroplasty was placed by Dr. Jodi Geralds, of [facility] in 2015.  He is out of town and I am on orthopedic call and I was asked to see Mr. Asaro.  He reports severe right leg pain with numbness in his right foot.  On exam, he was noted to have a significantly tight anterolateral and posterior compartments, almost hard as a rock.  He had numbness in his foot and weak pulses as well.  He did not appear to have vascular injury, but had obvious signs of a compartment syndrome with severe pain on passive stretch of  his toes.  I recognized that he needed to have emergent fasciotomies of his right leg and I talked to him about this and the need for spanning external fixation to better align the tibia temporarily.  He understands this as well and informed consent was obtained.  PROCEDURE DESCRIPTION:  After informed consent was obtained, appropriate right leg was marked.  He was brought urgently back to the operating room where general anesthesia was obtained after he was placed supine on the fracture table.  His right leg was then prepped and draped from the thigh down the toes with DuraPrep and sterile drape after precleaning his leg with pre-scrub.  Time-out was called and he was identified as correct patient and correct right leg.  I then made my first fasciotomy incision laterally.  I dissected the fascia and released both the anterior and lateral compartments, which opened up quite extensively and should he had significant swelling and hematoma in this area.  All muscles that were contractile.  I then went to the medial side and made a medial incision at the mid area of the tibia, carried this proximally and distally and dissected through the posterior superficial and deep compartments finding more extensive hematoma in these areas  as well.  I then irrigated both fasciotomy sites.  Next, proceeded with a spanning external fixation.  Two small stab incisions were made at the femur and two small stab incisions in the distal tibia.  I placed external fixation pins from anterior to posterior in the femur and the tibia both under direct fluoroscopy.  I then placed the external fixation bars to connect the pins proximally and distally and with pulled traction under direct fluoroscopy to better align the fracture, which we were able to do, we then locked this down tightly.  We then placed VAC sponges over both fasciotomy wounds and set these to suction, got a good seal.  He was then awakened, extubated  and taken to the recovery room in stable condition.  All final counts were correct.  There were no complications noted.     Vanita Panda. Magnus Ivan, M.D.     CYB/MEDQ  D:  07/03/2015  T:  07/03/2015  Job:  [id number]

## 2015-07-03 NOTE — ED Notes (Signed)
Ortho MD bedside.

## 2015-07-03 NOTE — ED Notes (Signed)
Mechanical fall off porch; right lower leg deformity. Given  Morphine in route by EMS. Bit tongue when he fell. Several bruises. States drinks ETOH daily, 5 beers today.

## 2015-07-03 NOTE — Brief Op Note (Signed)
07/03/2015  10:41 PM  PATIENT:  Joseph Jarvis  60 y.o. male  PRE-OPERATIVE DIAGNOSIS:  right tibia/fibula fractures, compartment syndrome  POST-OPERATIVE DIAGNOSIS:  right tibia/fibula fractures, compartment syndrome  PROCEDURE:  Procedure(s): COMPARTMENT SYNDROME RELEASE RIGHT LOWER LEG, APPLICATION OF LARGE EXTERNAL FIXATOR (Right)  SURGEON:  Surgeon(s) and Role:    * Kathryne Hitch, MD - Primary  ANESTHESIA:   general  EBL:  Total I/O In: 1000 [I.V.:1000] Out: 100 [Blood:100]  DRAINS: VAC sponge over fasciotomy incisions   COUNTS:  YES  TOURNIQUET:  * No tourniquets in log *  DICTATION: .Other Dictation: Dictation Number [id number]  PLAN OF CARE: Admit to inpatient   PATIENT DISPOSITION:  PACU - hemodynamically stable.   Delay start of Pharmacological VTE agent (>24hrs) due to surgical blood loss or risk of bleeding: no

## 2015-07-03 NOTE — ED Provider Notes (Signed)
CSN: [id number]     Arrival date & time 07/03/15  1755 History   First MD Initiated Contact with Patient 07/03/15 1759     Chief Complaint  Patient presents with  . 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 Patient fell down the slope of a ill built porch. He sustained a deformity to his right lower extremity. Patient had a knee replacement done by Dr. Luiz Blare 2 years ago. There is a gross deformity directly distal to the scar from the previous knee surgery.  She denies striking his head. Denies loss of consciousness. Patient denies pain anywhere elsewhere aside from the leg.  Patient expressing that he does not want a CAT scan at this time.    Past Medical History  Diagnosis Date  . Panic attack   . Back pain   . GERD (gastroesophageal reflux disease)   . Arthritis    Past Surgical History  Procedure Laterality Date  . No past surgeries    . Total knee arthroplasty Right 10/20/2013    Procedure: TOTAL KNEE ARTHROPLASTY;  Surgeon: Harvie Junior, MD;  Location: [facility] OR;  Service: Orthopedics;  Laterality: Right;   History reviewed. No pertinent family history. Social History  Substance Use Topics  . Smoking status: Current Every Day Smoker -- 1.00 packs/day for 30 years    Types: Cigarettes  . Smokeless tobacco: None  . Alcohol Use: Yes     Comment: mixed drink on weekend    Review of Systems  Constitutional: Negative for activity change.  Respiratory: Negative for shortness of breath.   Cardiovascular: Negative for chest pain.  Gastrointestinal: Negative for abdominal pain.  Musculoskeletal: Positive for joint swelling.      Allergies  Penicillins  Home Medications   Prior to Admission medications   Medication Sig Start Date End Date Taking? Authorizing Provider  ALPRAZolam Prudy Feeler) 1 MG tablet Take 1  mg by mouth 3 (three) times daily as needed for anxiety.   Yes Historical Provider, MD  losartan-hydrochlorothiazide (HYZAAR) 50-12.5 MG tablet Take 1 tablet by mouth daily. 04/12/15  Yes Historical Provider, MD  omeprazole (PRILOSEC OTC) 20 MG tablet Take 20 mg by mouth daily as needed (for heartburn).   Yes Historical Provider, MD  PARoxetine (PAXIL) 40 MG tablet Take 40 mg by mouth every morning.   Yes Historical Provider, MD  traZODone (DESYREL) 100 MG tablet Take 100 mg by mouth at bedtime as needed for sleep.   Yes Historical Provider, MD  aspirin EC 325 MG tablet Take 1 tablet (325 mg total) by mouth 2 (two) times daily after a meal. 10/20/13   Marshia Ly, PA-C  methocarbamol (ROBAXIN-750) 750 MG tablet Take 1 tablet (750 mg total) by mouth every 8 (eight) hours as needed for muscle spasms. 10/20/13   Marshia Ly, PA-C  oxyCODONE-acetaminophen (PERCOCET/ROXICET) 5-325 MG per tablet Take 1-2 tablets by mouth every 4 (four) hours as needed. 10/20/13   Marshia Ly, PA-C   BP 147/104 mmHg  Pulse 92  Temp(Src) 98.4 F (36.9 C) (Oral)  Resp 16  SpO2 93% Physical Exam  Constitutional: He is oriented to person, place, and time. He appears well-nourished.  HENT:  Head: Normocephalic.  No trauma to head or neck.  Eyes: Conjunctivae are normal.  Cardiovascular: Normal rate.   Pulmonary/Chest: Effort normal. No respiratory distress. He has no wheezes.  Abdominal: Soft. There is no tenderness. There  is no guarding.  Musculoskeletal:  Gross deformity directly distal from scar of old knee surgery. Pulses intact. Sensation and movement intact.  Neurological: He is oriented to person, place, and time.  Skin: Skin is warm and dry. He is not diaphoretic.  Psychiatric: He has a normal mood and affect.  Behavior consistent with intoxication.    ED Course  Procedures (including critical care time) Labs Review Labs Reviewed  CBC WITH DIFFERENTIAL/PLATELET - Abnormal; Notable for the following:     WBC 11.5 (*)    Neutro Abs 8.2 (*)    All other components within normal limits  COMPREHENSIVE METABOLIC PANEL - Abnormal; Notable for the following:    AST 91 (*)    All other components within normal limits  ETHANOL - Abnormal; Notable for the following:    Alcohol, Ethyl (B) 179 (*)    All other components within normal limits    Imaging Review Dg Knee 1-2 Views Right  07/03/2015  CLINICAL DATA:  Fall off porch earlier today, now with right leg pain and swelling. Initial encounter. EXAM: RIGHT KNEE - 1-2 VIEW COMPARISON:  None. FINDINGS: Right total knee arthroplasty in place. Comminuted displaced proximal tibial fracture involving the meta diaphysis. Fracture extends through the anterior medial aspect of the tibial stem. No definite involvement of the tibial tray. There is 7 mm displacement of the anterior dominant fracture fragment. Comminuted displaced proximal fibular fracture involving the fibular neck. No extension to the fibular head. Small knee joint effusion. IMPRESSION: Comminuted displaced proximal tibia and fibular fractures. Proximal tibia fracture extends to the anterior medial aspect of the tibial stem of the total knee arthroplasty. Electronically Signed   By: Rubye Oaks M.D.   On: 07/03/2015 19:20   Dg Tibia/fibula Right  07/03/2015  CLINICAL DATA:  Fall off porch today, pain and swelling of anterior lower leg. Initial encounter. EXAM: RIGHT TIBIA AND FIBULA - 2 VIEW COMPARISON:  None. FINDINGS: Right knee arthroplasty in place. Comminuted displaced proximal tibial fracture involving the meta diaphysis. Fracture extends through the anterior medial aspect of the tibial stem. No definite involvement of the tibial tray. There is 7 mm displacement of the anterior dominant fracture fragment, and 14 mm distraction of the medial fracture component. Comminuted displaced proximal fibular fracture involving the fibular neck. The distal tibia and fibula are intact. There is diffuse  soft tissue edema. IMPRESSION: Comminuted displaced proximal tibia and fibula fractures. Proximal tibia fracture extends to the tibial stem of right knee arthroplasty. Electronically Signed   By: Rubye Oaks M.D.   On: 07/03/2015 19:22   Dg Ankle 2 Views Right  07/03/2015  CLINICAL DATA:  Fall off porch earlier today. Now with right lower leg pain and swelling. Initial encounter. EXAM: RIGHT ANKLE - 2 VIEW COMPARISON:  None. FINDINGS: No fracture or dislocation. The alignment and joint spaces are maintained. Ankle mortise is preserved. Lateral soft tissue edema is noted. IMPRESSION: No fracture or subluxation of the right ankle. Electronically Signed   By: Rubye Oaks M.D.   On: 07/03/2015 19:23   I have personally reviewed and evaluated these images and lab results as part of my medical decision-making.   EKG Interpretation None      MDM   Final diagnoses:  Fall   patient is a 60 year old male presenting after intoxicated fall. Patient is gross deformity to right lower extremity. We'll get plain films. We'll give T gap. We'll get labs in case of need for surgery. Anticipate that we  may need reduction versus going to the OR.  8:56 PM Blackman at bedside, patient's compartment tighter. Pulses and sensation still present.     Malene Blaydes Randall An, MD 07/03/15 2056

## 2015-07-03 NOTE — Transfer of Care (Signed)
Immediate Anesthesia Transfer of Care Note  Patient: Joseph Jarvis  Procedure(s) Performed: Procedure(s): COMPARTMENT SYNDROME RELEASE RIGHT LOWER LEG, APPLICATION OF LARGE EXTERNAL FIXATOR (Right)  Patient Location: PACU  Anesthesia Type:General  Level of Consciousness: awake, alert  and oriented  Airway & Oxygen Therapy: Patient Spontanous Breathing  Post-op Assessment: Report given to RN and Post -op Vital signs reviewed and stable  Post vital signs: Reviewed and stable  Last Vitals:  Filed Vitals:   07/03/15 2000 07/03/15 2030  BP: 147/104 147/100  Pulse: 92 104  Temp:    Resp: 16 16    Complications: No apparent anesthesia complications

## 2015-07-03 NOTE — Addendum Note (Signed)
Addendum  created 07/03/15 2329 by Judie Petit, MD   Modules edited: Anesthesia Events, Narrator   Narrator:  Narrator: Event Log Edited

## 2015-07-04 LAB — MRSA PCR SCREENING: MRSA BY PCR: NEGATIVE

## 2015-07-04 MED ORDER — METHOCARBAMOL 500 MG PO TABS
500.0000 mg | ORAL_TABLET | Freq: Four times a day (QID) | ORAL | Status: DC | PRN
  Administered 2015-07-04 – 2015-07-11 (×14): 500 mg via ORAL
  Filled 2015-07-04 (×14): qty 1

## 2015-07-04 MED ORDER — ALPRAZOLAM 0.5 MG PO TABS
1.0000 mg | ORAL_TABLET | Freq: Three times a day (TID) | ORAL | Status: DC | PRN
  Administered 2015-07-04 – 2015-07-11 (×9): 1 mg via ORAL
  Filled 2015-07-04 (×10): qty 2

## 2015-07-04 MED ORDER — METOCLOPRAMIDE HCL 5 MG/ML IJ SOLN: 5.0000 mg | Freq: Three times a day (TID) | INTRAMUSCULAR | Status: DC | PRN

## 2015-07-04 MED ORDER — ONDANSETRON HCL 4 MG PO TABS: 4.0000 mg | ORAL_TABLET | Freq: Four times a day (QID) | ORAL | Status: DC | PRN

## 2015-07-04 MED ORDER — DIPHENHYDRAMINE HCL 12.5 MG/5ML PO ELIX: 12.5000 mg | ORAL_SOLUTION | ORAL | Status: DC | PRN

## 2015-07-04 MED ORDER — OXYCODONE HCL 5 MG PO TABS
5.0000 mg | ORAL_TABLET | ORAL | Status: DC | PRN
  Administered 2015-07-04 – 2015-07-05 (×7): 15 mg via ORAL
  Administered 2015-07-06: 10 mg via ORAL
  Administered 2015-07-06 – 2015-07-08 (×8): 15 mg via ORAL
  Administered 2015-07-08: 10 mg via ORAL
  Administered 2015-07-08 – 2015-07-09 (×4): 15 mg via ORAL
  Administered 2015-07-09: 10 mg via ORAL
  Administered 2015-07-10 – 2015-07-12 (×10): 15 mg via ORAL
  Filled 2015-07-04 (×3): qty 3
  Filled 2015-07-04: qty 2
  Filled 2015-07-04 (×6): qty 3
  Filled 2015-07-04: qty 2
  Filled 2015-07-04 (×11): qty 3
  Filled 2015-07-04: qty 2
  Filled 2015-07-04 (×9): qty 3

## 2015-07-04 MED ORDER — ONDANSETRON HCL 4 MG/2ML IJ SOLN
4.0000 mg | Freq: Four times a day (QID) | INTRAMUSCULAR | Status: DC | PRN
  Administered 2015-07-04: 4 mg via INTRAVENOUS
  Filled 2015-07-04: qty 2

## 2015-07-04 MED ORDER — TRAZODONE HCL 100 MG PO TABS
100.0000 mg | ORAL_TABLET | Freq: Every evening | ORAL | Status: DC | PRN
  Administered 2015-07-05 – 2015-07-11 (×7): 100 mg via ORAL
  Filled 2015-07-04 (×7): qty 1

## 2015-07-04 MED ORDER — ENOXAPARIN SODIUM 30 MG/0.3ML ~~LOC~~ SOLN
30.0000 mg | Freq: Two times a day (BID) | SUBCUTANEOUS | Status: DC
  Administered 2015-07-04 – 2015-07-12 (×15): 30 mg via SUBCUTANEOUS
  Filled 2015-07-04 (×15): qty 0.3

## 2015-07-04 MED ORDER — PAROXETINE HCL 20 MG PO TABS
40.0000 mg | ORAL_TABLET | Freq: Every day | ORAL | Status: DC
  Administered 2015-07-04 – 2015-07-12 (×7): 40 mg via ORAL
  Filled 2015-07-04 (×9): qty 2

## 2015-07-04 MED ORDER — LOSARTAN POTASSIUM 50 MG PO TABS
50.0000 mg | ORAL_TABLET | Freq: Every day | ORAL | Status: DC
  Administered 2015-07-04 – 2015-07-12 (×7): 50 mg via ORAL
  Filled 2015-07-04 (×7): qty 1

## 2015-07-04 MED ORDER — PANTOPRAZOLE SODIUM 40 MG PO TBEC
40.0000 mg | DELAYED_RELEASE_TABLET | Freq: Every day | ORAL | Status: DC | PRN
  Administered 2015-07-09: 40 mg via ORAL
  Filled 2015-07-04: qty 1

## 2015-07-04 MED ORDER — METHOCARBAMOL 1000 MG/10ML IJ SOLN
500.0000 mg | Freq: Four times a day (QID) | INTRAVENOUS | Status: DC | PRN
  Administered 2015-07-04 – 2015-07-07 (×4): 500 mg via INTRAVENOUS
  Filled 2015-07-04 (×8): qty 5

## 2015-07-04 MED ORDER — METOCLOPRAMIDE HCL 5 MG PO TABS: 5.0000 mg | ORAL_TABLET | Freq: Three times a day (TID) | ORAL | Status: DC | PRN

## 2015-07-04 MED ORDER — ACETAMINOPHEN 650 MG RE SUPP: 650.0000 mg | Freq: Four times a day (QID) | RECTAL | Status: DC | PRN

## 2015-07-04 MED ORDER — HYDROCHLOROTHIAZIDE 12.5 MG PO CAPS
12.5000 mg | ORAL_CAPSULE | Freq: Every day | ORAL | Status: DC
  Administered 2015-07-04 – 2015-07-12 (×7): 12.5 mg via ORAL
  Filled 2015-07-04 (×7): qty 1

## 2015-07-04 MED ORDER — ACETAMINOPHEN 325 MG PO TABS
650.0000 mg | ORAL_TABLET | Freq: Four times a day (QID) | ORAL | Status: DC | PRN
  Administered 2015-07-06 – 2015-07-07 (×2): 650 mg via ORAL
  Filled 2015-07-04 (×2): qty 2

## 2015-07-04 MED ORDER — LOSARTAN POTASSIUM-HCTZ 50-12.5 MG PO TABS: 1.0000 | ORAL_TABLET | Freq: Every day | ORAL | Status: DC

## 2015-07-04 MED ORDER — HYDROMORPHONE HCL 1 MG/ML IJ SOLN
1.0000 mg | INTRAMUSCULAR | Status: DC | PRN
  Administered 2015-07-04 – 2015-07-11 (×14): 1 mg via INTRAVENOUS
  Filled 2015-07-04 (×14): qty 1

## 2015-07-04 NOTE — Progress Notes (Signed)
Utilization review completed.  

## 2015-07-04 NOTE — Progress Notes (Signed)
Subjective: 1 Day Post-Op Procedure(s) (LRB): COMPARTMENT SYNDROME RELEASE RIGHT LOWER LEG, APPLICATION OF LARGE EXTERNAL FIXATOR (Right) Patient reports pain as moderate.    Objective: Vital signs in last 24 hours: Temp:  [97.9 F (36.6 C)-98.5 F (36.9 C)] 98.5 F (36.9 C) (02/19 0019) Pulse Rate:  [88-119] 118 (02/19 0019) Resp:  [12-24] 18 (02/18 2342) BP: (122-155)/(67-110) 122/69 mmHg (02/19 0019) SpO2:  [87 %-100 %] 100 % (02/19 0019)  Intake/Output from previous day: 02/18 0701 - 02/19 0700 In: 1675 [I.V.:1675] Out: 1425 [Urine:500; Drains:825; Blood:100] Intake/Output this shift:     Recent Labs  07/03/15 1818  HGB 14.2    Recent Labs  07/03/15 1818  WBC 11.5*  RBC 4.34  HCT 42.3  PLT 263    Recent Labs  07/03/15 1818  NA 141  K 4.9  CL 106  CO2 25  BUN 7  CREATININE 1.08  GLUCOSE 91  CALCIUM 8.9   No results for input(s): LABPT, INR in the last 72 hours.  Sensation intact distally Intact pulses distally Compartment soft  VAC over fasciotomy sites to suction with good seal   Assessment/Plan: 1 Day Post-Op Procedure(s) (LRB): COMPARTMENT SYNDROME RELEASE RIGHT LOWER LEG, APPLICATION OF LARGE EXTERNAL FIXATOR (Right)  Will start Lovenox for DVT coverage. Continue VAC for now. Will need definitive fixation at a later date once soft-tissue allows. Anticipate being able to eventually close medially, but may need skin graft laterally. Will not close fasciotomy incisions until definitive plating of the fracture because will likely need to utilize these incisions.  May likely consult Dr. Myrene Galas, Ortho Trauma, due to the complexity of this injury.   Kathryne Hitch 07/04/2015, 8:43 AM

## 2015-07-05 ENCOUNTER — Encounter (HOSPITAL_COMMUNITY): Payer: Self-pay | Admitting: General Practice

## 2015-07-05 LAB — CBC
HCT: 31.9 % — ABNORMAL LOW (ref 39.0–52.0)
HEMOGLOBIN: 10.1 g/dL — AB (ref 13.0–17.0)
MCH: 31.8 pg (ref 26.0–34.0)
MCHC: 31.7 g/dL (ref 30.0–36.0)
MCV: 100.3 fL — ABNORMAL HIGH (ref 78.0–100.0)
Platelets: 195 10*3/uL (ref 150–400)
RBC: 3.18 MIL/uL — AB (ref 4.22–5.81)
RDW: 13.9 % (ref 11.5–15.5)
WBC: 9.5 10*3/uL (ref 4.0–10.5)

## 2015-07-05 MED ORDER — PNEUMOCOCCAL VAC POLYVALENT 25 MCG/0.5ML IJ INJ: 0.5000 mL | INJECTION | INTRAMUSCULAR | Status: DC

## 2015-07-05 NOTE — Care Management Note (Signed)
Case Management Note  Patient Details  Name: EXODUS KUTZER MRN: [id number] Date of Birth: 09-Jan-1956  Subjective/Objective:                    Action/Plan:  Await surgical plan.  07-05-15 [employer] application and Medicaid DME request application both placed on shadow chart. MD if home [facility] needed please sign both and call case management . Thanks Ronny Flurry RN BSN [phone]   Expected Discharge Date:                  Expected Discharge Plan:     In-House Referral:     Discharge planning Services     Post Acute Care Choice:    Choice offered to:     DME Arranged:    DME Agency:     HH Arranged:    HH Agency:     Status of Service:  In process, will continue to follow  Medicare Important Message Given:    Date Medicare IM Given:    Medicare IM give by:    Date Additional Medicare IM Given:    Additional Medicare Important Message give by:     If discussed at Long Length of Stay Meetings, dates discussed:    Additional Comments:  Kingsley Plan, RN 07/05/2015, 10:26 AM

## 2015-07-05 NOTE — Anesthesia Preprocedure Evaluation (Addendum)
Anesthesia Evaluation  Patient identified by MRN, date of birth, ID band Patient awake    Reviewed: Allergy & Precautions, H&P , NPO status , Patient's Chart, lab work & pertinent test results, reviewed documented beta blocker date and time   History of Anesthesia Complications Negative for: history of anesthetic complications  Airway Mallampati: II  TM Distance: >3 FB Neck ROM: Full    Dental no notable dental hx. (+) Edentulous Upper, Edentulous Lower   Pulmonary Current Smoker,    + rhonchi        Cardiovascular hypertension, negative cardio ROS   Rhythm:Regular Rate:Normal     Neuro/Psych PSYCHIATRIC DISORDERS Anxiety    GI/Hepatic Neg liver ROS, GERD  ,  Endo/Other  negative endocrine ROS  Renal/GU negative Renal ROS     Musculoskeletal  (+) Arthritis ,   Abdominal   Peds  Hematology   Anesthesia Other Findings Coarse wheezes; smoker  Reproductive/Obstetrics                           Anesthesia Physical  Anesthesia Plan  ASA: II  Anesthesia Plan: General   Post-op Pain Management:    Induction: Intravenous  Airway Management Planned: Oral ETT  Additional Equipment:   Intra-op Plan:   Post-operative Plan: Extubation in OR  Informed Consent: I have reviewed the patients History and Physical, chart, labs and discussed the procedure including the risks, benefits and alternatives for the proposed anesthesia with the patient or authorized representative who has indicated his/her understanding and acceptance.   Dental advisory given  Plan Discussed with: Anesthesiologist and Surgeon  Anesthesia Plan Comments: (  Discussed general anesthesia, including possible nausea, instrumentation of airway, sore throat,pulmonary aspiration, etc. I asked if the were any outstanding questions, or  concerns before we proceeded. )      Anesthesia Quick Evaluation

## 2015-07-05 NOTE — Progress Notes (Signed)
Patient ID: Joseph Jarvis, male   DOB: Aug 28, 1955, 60 y.o.   MRN: [id number] No acute changes.  Right LE with soft calf.  EHL/FHL/TA intact.  Foot well perfused.  Will come up with definitive surgery plan as week progresses and soft-tissue swelling subsides.  Will need fixation with plates/screws.  i have asked Dr. Carola Frost, Ortho Trauma to assess as well.

## 2015-07-06 ENCOUNTER — Encounter (HOSPITAL_COMMUNITY)
Admission: EM | Disposition: A | Discharge status: Home / Self Care | Payer: Self-pay | Source: Home / Self Care | Attending: Orthopedic Surgery

## 2015-07-06 ENCOUNTER — Inpatient Hospital Stay (HOSPITAL_COMMUNITY): Payer: Medicaid Other

## 2015-07-06 ENCOUNTER — Inpatient Hospital Stay (HOSPITAL_COMMUNITY): Payer: Medicaid Other | Admitting: Anesthesiology

## 2015-07-06 ENCOUNTER — Encounter (HOSPITAL_COMMUNITY): Payer: Self-pay | Admitting: Certified Registered Nurse Anesthetist

## 2015-07-06 HISTORY — PX: ORIF TIBIA PLATEAU: SHX2132

## 2015-07-06 HISTORY — PX: APPLICATION OF WOUND VAC: SHX5189

## 2015-07-06 HISTORY — PX: SECONDARY CLOSURE OF WOUND: SHX6208

## 2015-07-06 LAB — CBC
HEMATOCRIT: 28.5 % — AB (ref 39.0–52.0)
HEMOGLOBIN: 9.2 g/dL — AB (ref 13.0–17.0)
MCH: 32.2 pg (ref 26.0–34.0)
MCHC: 32.3 g/dL (ref 30.0–36.0)
MCV: 99.7 fL (ref 78.0–100.0)
Platelets: 168 10*3/uL (ref 150–400)
RBC: 2.86 MIL/uL — ABNORMAL LOW (ref 4.22–5.81)
RDW: 13.8 % (ref 11.5–15.5)
WBC: 9.1 10*3/uL (ref 4.0–10.5)

## 2015-07-06 LAB — TYPE AND SCREEN
ABO/RH(D): A POS
ANTIBODY SCREEN: NEGATIVE

## 2015-07-06 SURGERY — OPEN REDUCTION INTERNAL FIXATION (ORIF) TIBIAL PLATEAU
Anesthesia: General | Laterality: Right

## 2015-07-06 MED ORDER — SODIUM CHLORIDE 0.9 % IV SOLN
Freq: Once | INTRAVENOUS | Status: AC
  Administered 2015-07-07: 21:00:00 via INTRAVENOUS

## 2015-07-06 MED ORDER — GLYCOPYRROLATE 0.2 MG/ML IJ SOLN
INTRAMUSCULAR | Status: DC | PRN
Start: 2015-07-06 — End: 2015-07-06
  Administered 2015-07-06: 0.1 mg via INTRAVENOUS
  Administered 2015-07-06: .6 mg via INTRAVENOUS

## 2015-07-06 MED ORDER — MIDAZOLAM HCL 2 MG/2ML IJ SOLN
INTRAMUSCULAR | Status: DC | PRN
  Administered 2015-07-06: 2 mg via INTRAVENOUS

## 2015-07-06 MED ORDER — NEOSTIGMINE METHYLSULFATE 10 MG/10ML IV SOLN
INTRAVENOUS | Status: DC | PRN
  Administered 2015-07-06: 4.5 mg via INTRAVENOUS

## 2015-07-06 MED ORDER — ROCURONIUM BROMIDE 100 MG/10ML IV SOLN
INTRAVENOUS | Status: DC | PRN
  Administered 2015-07-06 (×2): 20 mg via INTRAVENOUS
  Administered 2015-07-06: 60 mg via INTRAVENOUS

## 2015-07-06 MED ORDER — PROPOFOL 10 MG/ML IV BOLUS
INTRAVENOUS | Status: DC | PRN
  Administered 2015-07-06: 200 mg via INTRAVENOUS

## 2015-07-06 MED ORDER — PHENYLEPHRINE HCL 10 MG/ML IJ SOLN
INTRAMUSCULAR | Status: DC | PRN
  Administered 2015-07-06: 160 ug via INTRAVENOUS
  Administered 2015-07-06 (×2): 120 ug via INTRAVENOUS

## 2015-07-06 MED ORDER — SODIUM CHLORIDE 0.9 % IR SOLN
Status: DC | PRN
  Administered 2015-07-06 (×2): 1000 mL

## 2015-07-06 MED ORDER — ARTIFICIAL TEARS OP OINT
TOPICAL_OINTMENT | OPHTHALMIC | Status: AC
  Filled 2015-07-06: qty 3.5

## 2015-07-06 MED ORDER — ARTIFICIAL TEARS OP OINT
TOPICAL_OINTMENT | OPHTHALMIC | Status: DC | PRN
  Administered 2015-07-06: 1 via OPHTHALMIC

## 2015-07-06 MED ORDER — GLYCOPYRROLATE 0.2 MG/ML IJ SOLN
INTRAMUSCULAR | Status: AC
  Filled 2015-07-06: qty 3

## 2015-07-06 MED ORDER — NEOSTIGMINE METHYLSULFATE 10 MG/10ML IV SOLN
INTRAVENOUS | Status: AC
  Filled 2015-07-06: qty 1

## 2015-07-06 MED ORDER — ONDANSETRON HCL 4 MG/2ML IJ SOLN
INTRAMUSCULAR | Status: DC | PRN
  Administered 2015-07-06: 4 mg via INTRAVENOUS

## 2015-07-06 MED ORDER — METOPROLOL TARTRATE 1 MG/ML IV SOLN
INTRAVENOUS | Status: DC | PRN
  Administered 2015-07-06: 1 mg via INTRAVENOUS
  Administered 2015-07-06: 2 mg via INTRAVENOUS
  Administered 2015-07-06 (×2): 1 mg via INTRAVENOUS

## 2015-07-06 MED ORDER — METOPROLOL TARTRATE 1 MG/ML IV SOLN
INTRAVENOUS | Status: AC
  Filled 2015-07-06: qty 5

## 2015-07-06 MED ORDER — ROCURONIUM BROMIDE 50 MG/5ML IV SOLN
INTRAVENOUS | Status: AC
  Filled 2015-07-06: qty 2

## 2015-07-06 MED ORDER — PROPOFOL 10 MG/ML IV BOLUS
INTRAVENOUS | Status: AC
  Filled 2015-07-06: qty 20

## 2015-07-06 MED ORDER — FENTANYL CITRATE (PF) 250 MCG/5ML IJ SOLN
INTRAMUSCULAR | Status: DC | PRN
  Administered 2015-07-06: 50 ug via INTRAVENOUS
  Administered 2015-07-06: 100 ug via INTRAVENOUS
  Administered 2015-07-06 (×2): 50 ug via INTRAVENOUS
  Administered 2015-07-06: 250 ug via INTRAVENOUS

## 2015-07-06 MED ORDER — CEFAZOLIN SODIUM-DEXTROSE 2-3 GM-% IV SOLR
INTRAVENOUS | Status: DC | PRN
  Administered 2015-07-06: 2 g via INTRAVENOUS

## 2015-07-06 MED ORDER — LIDOCAINE HCL (CARDIAC) 20 MG/ML IV SOLN
INTRAVENOUS | Status: DC | PRN
  Administered 2015-07-06: 100 mg via INTRATRACHEAL

## 2015-07-06 MED ORDER — HYDROMORPHONE HCL 1 MG/ML IJ SOLN: 0.2500 mg | INTRAMUSCULAR | Status: DC | PRN

## 2015-07-06 MED ORDER — PHENYLEPHRINE HCL 10 MG/ML IJ SOLN
10.0000 mg | INTRAMUSCULAR | Status: DC | PRN
  Administered 2015-07-06: 50 ug/min via INTRAVENOUS
  Administered 2015-07-06: 125 ug/min via INTRAVENOUS

## 2015-07-06 MED ORDER — MIDAZOLAM HCL 2 MG/2ML IJ SOLN
INTRAMUSCULAR | Status: AC
  Filled 2015-07-06: qty 2

## 2015-07-06 MED ORDER — FENTANYL CITRATE (PF) 250 MCG/5ML IJ SOLN
INTRAMUSCULAR | Status: AC
  Filled 2015-07-06: qty 5

## 2015-07-06 MED ORDER — LIDOCAINE HCL (CARDIAC) 20 MG/ML IV SOLN
INTRAVENOUS | Status: AC
  Filled 2015-07-06: qty 5

## 2015-07-06 MED ORDER — ONDANSETRON HCL 4 MG/2ML IJ SOLN
INTRAMUSCULAR | Status: AC
  Filled 2015-07-06: qty 2

## 2015-07-06 MED ORDER — PHENYLEPHRINE 40 MCG/ML (10ML) SYRINGE FOR IV PUSH (FOR BLOOD PRESSURE SUPPORT)
PREFILLED_SYRINGE | INTRAVENOUS | Status: AC
  Filled 2015-07-06: qty 10

## 2015-07-06 MED ORDER — VANCOMYCIN HCL 10 G IV SOLR
1250.0000 mg | Freq: Two times a day (BID) | INTRAVENOUS | Status: DC
  Administered 2015-07-06 – 2015-07-10 (×9): 1250 mg via INTRAVENOUS
  Filled 2015-07-06 (×10): qty 1250

## 2015-07-06 MED ORDER — GLYCOPYRROLATE 0.2 MG/ML IJ SOLN
INTRAMUSCULAR | Status: AC
  Filled 2015-07-06: qty 1

## 2015-07-06 MED ORDER — LACTATED RINGERS IV SOLN
INTRAVENOUS | Status: DC | PRN
  Administered 2015-07-06 (×4): via INTRAVENOUS

## 2015-07-06 SURGICAL SUPPLY — 132 items
BANDAGE ACE 4X5 VEL STRL LF (GAUZE/BANDAGES/DRESSINGS) ×2 IMPLANT
BANDAGE ELASTIC 4 VELCRO ST LF (GAUZE/BANDAGES/DRESSINGS) ×3 IMPLANT
BANDAGE ELASTIC 6 VELCRO ST LF (GAUZE/BANDAGES/DRESSINGS) ×3 IMPLANT
BANDAGE ESMARK 6X9 LF (GAUZE/BANDAGES/DRESSINGS) ×1 IMPLANT
BIT DRILL 100X2.5XANTM LCK (BIT) IMPLANT
BIT DRILL 2.5X2.75 QC CALB (BIT) ×2 IMPLANT
BIT DRILL 3.5X5.5 QC CALB (BIT) ×2 IMPLANT
BIT DRILL CAL (BIT) IMPLANT
BIT DRL 100X2.5XANTM LCK (BIT) ×1
BLADE SURG 10 STRL SS (BLADE) ×3 IMPLANT
BLADE SURG 15 STRL LF DISP TIS (BLADE) ×1 IMPLANT
BLADE SURG 15 STRL SS (BLADE) ×3
BLADE SURG ROTATE 9660 (MISCELLANEOUS) IMPLANT
BNDG CMPR 9X6 STRL LF SNTH (GAUZE/BANDAGES/DRESSINGS) ×1
BNDG CMPR MED 10X6 ELC LF (GAUZE/BANDAGES/DRESSINGS) ×1
BNDG COHESIVE 4X5 TAN STRL (GAUZE/BANDAGES/DRESSINGS) ×3 IMPLANT
BNDG ELASTIC 6X10 VLCR STRL LF (GAUZE/BANDAGES/DRESSINGS) ×2 IMPLANT
BNDG ESMARK 6X9 LF (GAUZE/BANDAGES/DRESSINGS) ×3
BNDG GAUZE ELAST 4 BULKY (GAUZE/BANDAGES/DRESSINGS) ×4 IMPLANT
BNDG GAUZE STRTCH 6 (GAUZE/BANDAGES/DRESSINGS) ×9 IMPLANT
BRUSH SCRUB DISP (MISCELLANEOUS) ×6 IMPLANT
CANISTER SUCT 3000ML PPV (MISCELLANEOUS) ×3 IMPLANT
CANISTER SUCTION 2500CC (MISCELLANEOUS) ×3 IMPLANT
CANISTER WOUND CARE 500ML ATS (WOUND CARE) ×5 IMPLANT
COVER MAYO STAND STRL (DRAPES) ×3 IMPLANT
COVER SURGICAL LIGHT HANDLE (MISCELLANEOUS) ×6 IMPLANT
DRAPE C-ARM 42X72 X-RAY (DRAPES) ×3 IMPLANT
DRAPE C-ARMOR (DRAPES) ×3 IMPLANT
DRAPE EXTREMITY T 121X128X90 (DRAPE) IMPLANT
DRAPE INCISE IOBAN 66X45 STRL (DRAPES) ×3 IMPLANT
DRAPE ORTHO SPLIT 77X108 STRL (DRAPES)
DRAPE PROXIMA HALF (DRAPES) IMPLANT
DRAPE SURG ORHT 6 SPLT 77X108 (DRAPES) IMPLANT
DRAPE U-SHAPE 47X51 STRL (DRAPES) ×3 IMPLANT
DRILL BIT 2.5MM (BIT) ×3
DRILL BIT 2.7X100 214235006 DU (MISCELLANEOUS) ×2 IMPLANT
DRILL BIT CAL (BIT) ×3
DRSG ADAPTIC 3X8 NADH LF (GAUZE/BANDAGES/DRESSINGS) ×3 IMPLANT
DRSG MEPITEL 3X4 ME34 (GAUZE/BANDAGES/DRESSINGS) ×6 IMPLANT
DRSG MEPITEL 4X7.2 (GAUZE/BANDAGES/DRESSINGS) ×6 IMPLANT
DRSG PAD ABDOMINAL 8X10 ST (GAUZE/BANDAGES/DRESSINGS) ×12 IMPLANT
DRSG VAC ATS LRG SENSATRAC (GAUZE/BANDAGES/DRESSINGS) ×2 IMPLANT
DRSG VAC ATS MED SENSATRAC (GAUZE/BANDAGES/DRESSINGS) IMPLANT
DRSG VAC ATS SM SENSATRAC (GAUZE/BANDAGES/DRESSINGS) IMPLANT
ELECT CAUTERY BLADE 6.4 (BLADE) IMPLANT
ELECT REM PT RETURN 9FT ADLT (ELECTROSURGICAL) ×3
ELECTRODE REM PT RTRN 9FT ADLT (ELECTROSURGICAL) ×1 IMPLANT
EVACUATOR 1/8 PVC DRAIN (DRAIN) IMPLANT
EVACUATOR 3/16  PVC DRAIN (DRAIN)
EVACUATOR 3/16 PVC DRAIN (DRAIN) IMPLANT
GAUZE SPONGE 4X4 12PLY STRL (GAUZE/BANDAGES/DRESSINGS) ×11 IMPLANT
GAUZE SPONGE 4X4 16PLY XRAY LF (GAUZE/BANDAGES/DRESSINGS) ×4 IMPLANT
GAUZE XEROFORM 5X9 LF (GAUZE/BANDAGES/DRESSINGS) ×3 IMPLANT
GEL ULTRASOUND 20GR AQUASONIC (MISCELLANEOUS) ×2 IMPLANT
GLOVE BIO SURGEON STRL SZ 6.5 (GLOVE) ×1 IMPLANT
GLOVE BIO SURGEON STRL SZ7.5 (GLOVE) ×3 IMPLANT
GLOVE BIO SURGEON STRL SZ8 (GLOVE) ×3 IMPLANT
GLOVE BIO SURGEONS STRL SZ 6.5 (GLOVE) ×1
GLOVE BIOGEL PI IND STRL 6.5 (GLOVE) IMPLANT
GLOVE BIOGEL PI IND STRL 7.5 (GLOVE) ×1 IMPLANT
GLOVE BIOGEL PI IND STRL 8 (GLOVE) ×2 IMPLANT
GLOVE BIOGEL PI IND STRL 9 (GLOVE) IMPLANT
GLOVE BIOGEL PI INDICATOR 6.5 (GLOVE) ×2
GLOVE BIOGEL PI INDICATOR 7.5 (GLOVE) ×4
GLOVE BIOGEL PI INDICATOR 8 (GLOVE) ×6
GLOVE BIOGEL PI INDICATOR 9 (GLOVE) ×2
GLOVE BIOGEL PI ORTHO PRO SZ8 (GLOVE) ×2
GLOVE PI ORTHO PRO STRL SZ8 (GLOVE) IMPLANT
GOWN STRL REUS W/ TWL LRG LVL3 (GOWN DISPOSABLE) ×2 IMPLANT
GOWN STRL REUS W/ TWL XL LVL3 (GOWN DISPOSABLE) ×1 IMPLANT
GOWN STRL REUS W/TWL LRG LVL3 (GOWN DISPOSABLE) ×6
GOWN STRL REUS W/TWL XL LVL3 (GOWN DISPOSABLE) ×3
HANDPIECE INTERPULSE COAX TIP (DISPOSABLE)
IMMOBILIZER KNEE 22 UNIV (SOFTGOODS) ×3 IMPLANT
K-WIRE ACE 1.6X6 (WIRE) ×21
KIT BASIN OR (CUSTOM PROCEDURE TRAY) ×3 IMPLANT
KIT ROOM TURNOVER OR (KITS) ×3 IMPLANT
KWIRE ACE 1.6X6 (WIRE) IMPLANT
MANIFOLD NEPTUNE II (INSTRUMENTS) ×3 IMPLANT
NDL SUT 6 .5 CRC .975X.05 MAYO (NEEDLE) IMPLANT
NEEDLE 22X1 1/2 (OR ONLY) (NEEDLE) IMPLANT
NEEDLE MAYO TAPER (NEEDLE)
NS IRRIG 1000ML POUR BTL (IV SOLUTION) ×3 IMPLANT
PACK ORTHO EXTREMITY (CUSTOM PROCEDURE TRAY) ×3 IMPLANT
PAD ARMBOARD 7.5X6 YLW CONV (MISCELLANEOUS) ×6 IMPLANT
PAD CAST 4YDX4 CTTN HI CHSV (CAST SUPPLIES) ×1 IMPLANT
PADDING CAST COTTON 4X4 STRL (CAST SUPPLIES) ×3
PADDING CAST COTTON 6X4 STRL (CAST SUPPLIES) ×3 IMPLANT
PLATE LOCK 15H STD RT PROX TIB (Plate) ×2 IMPLANT
SCREW CORT 3.5X30 815037030 (Screw) ×4 IMPLANT
SCREW CORT 3.5X32 815037032 (Screw) ×2 IMPLANT
SCREW CORT 3.5X46 815037046 (Screw) ×2 IMPLANT
SCREW CORTICAL 3.5MM  55MM (Screw) ×2 IMPLANT
SCREW CORTICAL 3.5MM 48MM (Screw) ×2 IMPLANT
SCREW CORTICAL 3.5MM 55MM (Screw) IMPLANT
SCREW LOCK CORT STAR 3.5X18 (Screw) ×4 IMPLANT
SCREW LOCK CORT STAR 3.5X20 (Screw) ×2 IMPLANT
SCREW LOCK CORT STAR 3.5X30 (Screw) ×2 IMPLANT
SCREW LOCK CORT STAR 3.5X32 (Screw) ×2 IMPLANT
SCREW LOCK CORT STAR 3.5X60 (Screw) ×2 IMPLANT
SCREW LOCK CORT STAR 3.5X75 (Screw) ×2 IMPLANT
SCREW LOW PROF CORTICAL 3.5X80 (Screw) ×2 IMPLANT
SCREW LP 3.5X75MM (Screw) ×2 IMPLANT
SET HNDPC FAN SPRY TIP SCT (DISPOSABLE) IMPLANT
SPONGE GAUZE 4X4 12PLY STER LF (GAUZE/BANDAGES/DRESSINGS) ×2 IMPLANT
SPONGE LAP 18X18 X RAY DECT (DISPOSABLE) ×5 IMPLANT
STAPLER VISISTAT 35W (STAPLE) ×3 IMPLANT
STOCKINETTE IMPERVIOUS 9X36 MD (GAUZE/BANDAGES/DRESSINGS) ×3 IMPLANT
STOCKINETTE IMPERVIOUS LG (DRAPES) ×3 IMPLANT
SUCTION FRAZIER HANDLE 10FR (MISCELLANEOUS) ×2
SUCTION TUBE FRAZIER 10FR DISP (MISCELLANEOUS) ×1 IMPLANT
SUT ETHILON 2 0 PSLX (SUTURE) ×2 IMPLANT
SUT ETHILON 3 0 PS 1 (SUTURE) IMPLANT
SUT PDS AB 2-0 CT1 27 (SUTURE) IMPLANT
SUT PROLENE 0 CT 2 (SUTURE) ×6 IMPLANT
SUT VIC AB 0 CT1 27 (SUTURE) ×3
SUT VIC AB 0 CT1 27XBRD ANBCTR (SUTURE) ×1 IMPLANT
SUT VIC AB 1 CT1 27 (SUTURE) ×3
SUT VIC AB 1 CT1 27XBRD ANBCTR (SUTURE) ×1 IMPLANT
SUT VIC AB 2-0 CT1 27 (SUTURE) ×6
SUT VIC AB 2-0 CT1 TAPERPNT 27 (SUTURE) ×2 IMPLANT
SYR 20ML ECCENTRIC (SYRINGE) IMPLANT
SYR BULB IRRIGATION 50ML (SYRINGE) ×4 IMPLANT
TOWEL OR 17X24 6PK STRL BLUE (TOWEL DISPOSABLE) ×3 IMPLANT
TOWEL OR 17X26 10 PK STRL BLUE (TOWEL DISPOSABLE) ×6 IMPLANT
TRAY FOLEY CATH 16FRSI W/METER (SET/KITS/TRAYS/PACK) IMPLANT
TUBE ANAEROBIC SPECIMEN COL (MISCELLANEOUS) IMPLANT
TUBE CONNECTING 12'X1/4 (SUCTIONS) ×1
TUBE CONNECTING 12X1/4 (SUCTIONS) ×2 IMPLANT
UNDERPAD 30X30 INCONTINENT (UNDERPADS AND DIAPERS) ×3 IMPLANT
WATER STERILE IRR 1000ML POUR (IV SOLUTION) ×6 IMPLANT
YANKAUER SUCT BULB TIP NO VENT (SUCTIONS) ×3 IMPLANT

## 2015-07-06 NOTE — Progress Notes (Signed)
Pt easily arousable, oriented to person and follows commands, gets agitated at time but denies pain. Dr Sherron Ales fully updated. Will cont. To monitor.

## 2015-07-06 NOTE — Anesthesia Postprocedure Evaluation (Signed)
Anesthesia Post Note  Patient: Joseph Jarvis  Procedure(s) Performed: Procedure(s) (LRB): OPEN REDUCTION INTERNAL FIXATION (ORIF) TIBIAL PLATEAU (Right) SECONDARY CLOSURE OF WOUND (Right)  WOUND VAC PLACEMENT (Right)  Patient location during evaluation: PACU Anesthesia Type: General Level of consciousness: sedated Pain management: satisfactory to patient Vital Signs Assessment: post-procedure vital signs reviewed and stable Respiratory status: spontaneous breathing Cardiovascular status: stable Anesthetic complications: no    Last Vitals:  Filed Vitals:   07/06/15 1221 07/06/15 1230  BP: 110/63   Pulse:  94  Temp:    Resp:  16    Last Pain:  Filed Vitals:   07/06/15 1232  PainSc: 2                  Payten Beaumier EDWARD

## 2015-07-06 NOTE — Progress Notes (Signed)
ANTIBIOTIC CONSULT NOTE - INITIAL  Pharmacy Consult:  Vancomycin Indication:  Empiric for open fasciotomies  Allergies  Allergen Reactions  . Penicillins Swelling    Patient Measurements: Height:  (185.4 cm) Weight: 205 lb (92.987 kg) IBW/kg (Calculated) : 79.9  Vital Signs: Temp: 99.6 F (37.6 C) (02/21 1308) Temp Source: Oral (02/21 1308) BP: 121/64 mmHg (02/21 1308) Pulse Rate: 92 (02/21 1308) Intake/Output from previous day: 02/20 0701 - 02/21 0700 In: 1368.2 [P.O.:1140; I.V.:228.2] Out: 1605 [Urine:1375; Drains:230] Intake/Output from this shift: Total I/O In: 3100 [I.V.:3100] Out: 1425 [Urine:1025; Drains:150; Blood:250]  Labs:  Recent Labs  07/03/15 1818 07/05/15 0551 07/06/15 0745  WBC 11.5* 9.5 9.1  HGB 14.2 10.1* 9.2*  PLT 263 195 168  CREATININE 1.08  --   --    Estimated Creatinine Clearance: 81.2 mL/min (by C-G formula based on Cr of 1.08). No results for input(s): VANCOTROUGH, VANCOPEAK, VANCORANDOM, GENTTROUGH, GENTPEAK, GENTRANDOM, TOBRATROUGH, TOBRAPEAK, TOBRARND, AMIKACINPEAK, AMIKACINTROU, AMIKACIN in the last 72 hours.   Microbiology: Recent Results (from the past 720 hour(s))  MRSA PCR Screening     Status: None   Collection Time: 07/04/15  1:07 AM  Result Value Ref Range Status   MRSA by PCR NEGATIVE NEGATIVE Final    Comment:        The GeneXpert MRSA Assay (FDA approved for NASAL specimens only), is one component of a comprehensive MRSA colonization surveillance program. It is not intended to diagnose MRSA infection nor to guide or monitor treatment for MRSA infections.     Medical History: Past Medical History  Diagnosis Date  . Panic attack   . Back pain   . GERD (gastroesophageal reflux disease)   . Arthritis   . Hypertension       Assessment: 5 YOM presented on 07/03/15 with right leg pain and swelling.  He has a history of right TKR in 2015 from a fall.  He is s/p application of external fixator on 07/03/15  and surgery for right tibia fracture today 07/06/15.  Pharmacy consulted to manage vancomycin as empiric therapy for open fasciotomies.  Noted patient received Ancef around 0900 today.  Baseline labs reviewed.  Vanc 2/21 >> Clinda 2/18 >> 2/19   Goal of Therapy:  Vancomycin trough level 10-15 mcg/ml   Plan:  - Vanc  IV Q12H - Monitor renal fxn, clinical progress, vanc trough as indicated - BMET in AM   Benn Tarver D. Laney Potash, PharmD, BCPS Pager:  [phone] 07/06/2015, 1:29 PM

## 2015-07-06 NOTE — Progress Notes (Signed)
I discussed with the patient the risks and benefits of surgery for his right tibia fracture, including the possibility of infection, nerve injury, vessel injury, wound breakdown, arthritis, symptomatic hardware, DVT/ PE, loss of motion, and need for further surgery among others.  We also specifically discussed the potential for infection or destabilization of his total knee arthroplasty.  He understood these risks and wished to proceed.  Myrene Galas, MD [facility] [phone] (p)

## 2015-07-06 NOTE — Transfer of Care (Signed)
Immediate Anesthesia Transfer of Care Note  Patient: Joseph Jarvis  Procedure(s) Performed: Procedure(s): OPEN REDUCTION INTERNAL FIXATION (ORIF) TIBIAL PLATEAU (Right) SECONDARY CLOSURE OF WOUND (Right)  WOUND VAC PLACEMENT (Right)  Patient Location: PACU  Anesthesia Type:General  Level of Consciousness: awake, alert , oriented and patient cooperative  Airway & Oxygen Therapy: Patient Spontanous Breathing and Patient connected to face mask oxygen  Post-op Assessment: Report given to RN, Post -op Vital signs reviewed and stable, Patient moving all extremities X 4 and Patient able to stick tongue midline  Post vital signs: Reviewed and stable  Last Vitals:  Filed Vitals:   07/05/15 2051 07/06/15 0501  BP: 117/65 126/60  Pulse: 114 103  Temp: 37.1 C 37.2 C  Resp: 17 17    Complications: No apparent anesthesia complications

## 2015-07-06 NOTE — Anesthesia Procedure Notes (Signed)
Date/Time: 07/06/2015 8:28 AM Performed by: Salomon Mast Pre-anesthesia Checklist: Patient identified, Emergency Drugs available, Suction available and Patient being monitored Patient Re-evaluated:Patient Re-evaluated prior to inductionOxygen Delivery Method: Circle system utilized Preoxygenation: Pre-oxygenation with 100% oxygen Intubation Type: IV induction Ventilation: Mask ventilation without difficulty Laryngoscope Size: Mac and 3 Grade View: Grade I Tube type: Oral Tube size: 7.5 mm Airway Equipment and Method: Stylet Placement Confirmation: ETT inserted through vocal cords under direct vision,  positive ETCO2 and breath sounds checked- equal and bilateral Secured at: 23 cm Tube secured with: Tape Dental Injury: Teeth and Oropharynx as per pre-operative assessment

## 2015-07-06 NOTE — Op Note (Signed)
NAMETRAVARIS, KOSH NO.:  [id number]  MEDICAL RECORD NO.:  [id number]  LOCATION:  6N07C                        FACILITY:  [facility]  PHYSICIAN:  Doralee Albino. Carola Frost, M.D. DATE OF BIRTH:  06-07-1955  DATE OF PROCEDURE:  07/06/2015 DATE OF DISCHARGE:                              OPERATIVE REPORT   PREOPERATIVE DIAGNOSES: 1. Right periprosthetic tibial plateau fracture. 2. Tibial shaft fracture. 3. Compartment syndrome. 4. Retained external fixator.  POSTOPERATIVE DIAGNOSES: 1. Right periprosthetic tibial plateau fracture. 2. Tibial shaft fracture. 3. Compartment syndrome. 4. Retained external fixator.  PROCEDURES: 1. Open reduction and internal fixation of tibial plateau. 2. Open reduction and internal fixation of tibial shaft fracture. 3. Removal of external fixator under anesthesia. 4. Complex layered closure medial fasciotomy, 16cm 5. Application of large wound VAC.  SURGEON:  Doralee Albino. Carola Frost, M.D.  ASSISTANT:  Montez Morita, PA-C.  ANESTHESIA:  General.  COMPLICATIONS:  None.  TOURNIQUET:  None.  DISPOSITION:  To PACU.  CONDITION:  Stable.  BRIEF SUMMARY AND INDICATIONS FOR PROCEDURE:  Joseph Jarvis is a 60 year old male, who sustained a fracture of his right tibia including fractures involving both the shaft as well as the tibial plateau the extension up to the total joint implant.  He underwent a compartment release by Dr. Allie Bossier for compartment syndrome with placement of a wound VAC and an external fixator.  I discussed with the patient the risks and benefits of further surgical treatment including the potential for infection of his total knee resulting in need for revision and potential loss of a functional result, also nonunion, malunion, hardware failure, need for skin grafting, other nerve or vessel injury, loss of motion and multiple others.  The patient acknowledged these risks and strongly wished to proceed.  BRIEF SUMMARY OF  PROCEDURE:  The patient was taken to the operating room where general anesthesia was induced.  His right lower extremity was prepped and draped in usual sterile fashion.  We were careful to prep out the external fixator and maintain it during this prepping and draping so as to avoid the potential for neurologic injury given the location of his tibial fractures.  After time-out, the patient underwent irrigation and evaluation of his wound.  All muscle was pink and contractile.  The compartments had adequate fascial release.  The wound was irrigated thoroughly and a limited excisional debridement performed of some disrupted muscle fibers sharply excising them with the Metzenbaum scissors.  This material was approximately 1 cm deep.  The fixator again was removed while protecting the surgical wounds and further chlorhexidine wash and lavage performed.  The incision was extended slightly both proximally and distally to facilitate improved exposure with limited soft tissue retraction and disruption.  The longest available tibial plateau plate was brought in in hopes of treating both the tibial plateau and shaft component.  It was of sufficient length.  We then began in a stepwise fashion beginning at the plateau and placing standard fixation to squeeze the medial and lateral aspects of the plateau together and with apposition of the plate to the bone while my assistant pulled traction.  I then provisionally reduced and pinned the tibial shaft  fracture.  I later exchanged this for an anterior to posterior set of lag screws.  I continued with the plateau by placing multiple K-wires anterior to posterior while my assistant pulled traction.  I pulled proximally on the wires from the anterior surface to restore appropriate slope of the articular surface.  Once this was completed, x-rays demonstrated a marked improvement in the slope which began at least 15 degrees of an anterior orientation.   I then turned distally where additional fixation using standard screws was placed into the shaft.  I did apply the [facility] clamp in limited fashion from the lateral plate to the medial cortex to the fasciotomy site.  We were very careful of the soft tissues throughout.  The final images showed appropriate restoration of length, alignment, and appropriate hardware trajectory and length as well.  Wounds were thoroughly irrigated and then the medial side closed in standard layered fashion, the lateral side was closed in standard layered fashion proximally over the plate, but for the most part, was left open leaving a wound that was still 8 cm wide and 18 cm in length.  Wound VAC was applied, and then a dressing from foot to thigh.  Montez Morita, PA-C assisted me throughout and his assistance was absolutely necessary for safe and effective completion of the case.  PROGNOSIS:  Mr. Davie remains at significantly elevated risk for deep infection and contamination of his total knee arthroplasty given the need for both fasciotomy and temporary external fixation.  I did believe this was the widest interval in which to place definitive internal fixation as a further retention of the fixator could result in contamination of the bone and release the tibia and with a contiguous plate would pin further risk.  Furthermore, closure at the next procedure is unlikely and skin grafting may need to be undertaken, and so it would have been a little gained by another few days without fixation internally.  He will be on formal DVT prophylaxis and nonweightbearing with resumption of motion seen as the stability of the soft tissues allow.     Doralee Albino. Carola Frost, M.D.     MHH/MEDQ  D:  07/06/2015  T:  07/06/2015  Job:  [id number]

## 2015-07-06 NOTE — Care Management Note (Signed)
Case Management Note  Patient Details  Name: Joseph Jarvis MRN: [id number] Date of Birth: 04-06-1956  Subjective/Objective:                    Action/Plan:  Continuing to follow for discharge needs VAC application in shadow chart .  Expected Discharge Date:                  Expected Discharge Plan:     In-House Referral:     Discharge planning Services     Post Acute Care Choice:    Choice offered to:     DME Arranged:    DME Agency:     HH Arranged:    HH Agency:     Status of Service:  In process, will continue to follow  Medicare Important Message Given:    Date Medicare IM Given:    Medicare IM give by:    Date Additional Medicare IM Given:    Additional Medicare Important Message give by:     If discussed at Long Length of Stay Meetings, dates discussed:    Additional Comments:  Kingsley Plan, RN 07/06/2015, 2:57 PM

## 2015-07-06 NOTE — Brief Op Note (Signed)
07/03/2015 - 07/06/2015  2:03 PM  PATIENT:  Joseph Jarvis  60 y.o. male  PRE-OPERATIVE DIAGNOSIS:   1. RIGHT TIBIAL PLATEAU FRACTURE 2. RIGHT TIBIAL SHAFT FRACTURE 3. RIGHT LEG COMPARTMENT SYNDROME 4. RETAINED EXTERNAL FIXATOR  POST-OPERATIVE DIAGNOSIS:  RIGHT TIBIAL FRACTURE,COMPARTMENT SYNDROME  PROCEDURE:  Procedure(s): 1. OPEN REDUCTION INTERNAL FIXATION (ORIF) TIBIAL PLATEAU (Right) 2. OPEN REDUCTION INTERNAL FIXATION (ORIF) TIBIAL SHAFT  (Right) 3. REMOVAL OF EXTERNAL FIXATOR 4. SECONDARY CLOSURE OF WOUND (Right), MEDIAL 16CM 5. WOUND VAC PLACEMENT (Right)  SURGEON:  Surgeon(s) and Role:    * Myrene Galas, MD - Primary  PHYSICIAN ASSISTANT: Montez Morita, PA-C  ANESTHESIA:   general  I/O:  Total I/O In: 3100 [I.V.:3100] Out: 1425 [Urine:1025; Drains:150; Blood:250]  SPECIMEN:  No Specimen  TOURNIQUET:  * No tourniquets in log *  DICTATION: .Other Dictation: Dictation Number [id number]

## 2015-07-07 ENCOUNTER — Encounter (HOSPITAL_COMMUNITY): Payer: Self-pay | Admitting: Orthopedic Surgery

## 2015-07-07 LAB — BASIC METABOLIC PANEL
Anion gap: 5 (ref 5–15)
BUN: 9 mg/dL (ref 6–20)
CHLORIDE: 98 mmol/L — AB (ref 101–111)
CO2: 31 mmol/L (ref 22–32)
Calcium: 7.9 mg/dL — ABNORMAL LOW (ref 8.9–10.3)
Creatinine, Ser: 0.93 mg/dL (ref 0.61–1.24)
GLUCOSE: 114 mg/dL — AB (ref 65–99)
POTASSIUM: 4.2 mmol/L (ref 3.5–5.1)
Sodium: 134 mmol/L — ABNORMAL LOW (ref 135–145)

## 2015-07-07 NOTE — Progress Notes (Signed)
[facility] Progress Note  Subjective  Doing ok  No new issues   VAC machine broken, awaiting new unit. This just occurred per nurse   ROS As above  Objective   BP 120/70 mmHg  Pulse 94  Temp(Src) 97.1 F (36.2 C) (Oral)  Resp 18  Ht '6\' 1"'  (1.854 m)  Wt 92.987 kg (205 lb)  BMI 27.05 kg/m2  SpO2 95%  Intake/Output      02/21 0701 - 02/22 0700 02/22 0701 - 02/23 0700   P.O. 1260 480   I.V. (mL/kg) 4363.8 (46.9)    Other 100    Total Intake(mL/kg) 5723.8 (61.6) 480 (5.2)   Urine (mL/kg/hr) 2725 (1.2)    Drains 300 (0.1)    Stool     Blood 250 (0.1)    Total Output 3275     Net +2448.8 +480          Labs Results for Joseph Jarvis, Joseph Jarvis (MRN [id number]) as of 07/07/2015 10:21  Ref. Range 07/07/2015 06:24  Sodium Latest Ref Range: 135-145 mmol/L 134 (L)  Potassium Latest Ref Range: 3.5-5.1 mmol/L 4.2  Chloride Latest Ref Range: 101-111 mmol/L 98 (L)  CO2 Latest Ref Range: 22-32 mmol/L 31  BUN Latest Ref Range: 6-20 mg/dL 9  Creatinine Latest Ref Range: 0.61-1.24 mg/dL 0.93  Calcium Latest Ref Range: 8.9-10.3 mg/dL 7.9 (L)  EGFR (Non-African Amer.) Latest Ref Range: >60 mL/min >60  EGFR (African American) Latest Ref Range: >60 mL/min >60  Glucose Latest Ref Range: 65-99 mg/dL 114 (H)  Anion gap Latest Ref Range: 5-15  5    Exam  Gen: awake, resting comfortably in bed, NAD Ext:       Right Lower extremity   Dressing stable  New vac machine functioning  Distal motor and sensory functions grossly intact   Ext warm    Assessment and Plan   POD/HD#: 1  60 y/o male s/p fall with 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, R leg compartments syndrome   1. 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, R leg compartments syndrome  NWB R leg  Aggressive ice and elevation of R leg for next 2 days  Return to OR Friday   Suspect we will perform STSG at that time    2. Pain management:  Continue with current regimen  3.  ABL anemia/Hemodynamics  Check cbc in am   4. Medical issues   Home meds  5. DVT/PE prophylaxis:  lovenox   6. ID:   Scheduled abx for open fasciotomies   7. FEN/GI prophylaxis/Foley/Lines:  Diet as tolerated   8. Dispo:  OR Friday     Jari Pigg, PA-C [facility] [phone] (O) 07/07/2015 10:21 AM

## 2015-07-08 NOTE — Anesthesia Preprocedure Evaluation (Signed)
Anesthesia Evaluation    Reviewed: Allergy & Precautions, H&P , Patient's Chart, lab work & pertinent test results, reviewed documented beta blocker date and time   History of Anesthesia Complications Negative for: history of anesthetic complications  Airway Mallampati: II  TM Distance: >3 FB Neck ROM: Full    Dental no notable dental hx. (+) Edentulous Upper, Edentulous Lower   Pulmonary Current Smoker,    + rhonchi        Cardiovascular hypertension, negative cardio ROS   Rhythm:Regular Rate:Normal     Neuro/Psych PSYCHIATRIC DISORDERS Anxiety    GI/Hepatic Neg liver ROS, GERD  ,  Endo/Other  negative endocrine ROS  Renal/GU negative Renal ROS     Musculoskeletal  (+) Arthritis ,   Abdominal   Peds  Hematology   Anesthesia Other Findings Coarse wheezes; smoker  Reproductive/Obstetrics                             Anesthesia Physical  Anesthesia Plan  ASA: II  Anesthesia Plan: General   Post-op Pain Management:    Induction: Intravenous  Airway Management Planned: Oral ETT  Additional Equipment:   Intra-op Plan:   Post-operative Plan: Extubation in OR  Informed Consent:   Plan Discussed with: Anesthesiologist and Surgeon  Anesthesia Plan Comments: (  )        Anesthesia Quick Evaluation

## 2015-07-08 NOTE — Progress Notes (Signed)
Orthopaedic Trauma Service Progress Note  Subjective  Doing well No complaints Ready for surgery tomorrow   ROS As above  Objective   BP 111/59 mmHg  Pulse 100  Temp(Src) 98.2 F (36.8 C) (Oral)  Resp 16  Ht  (1.854 m)  Wt 92.987 kg (205 lb)  BMI 27.05 kg/m2  SpO2 96%  Intake/Output      02/23 0701 - 02/24 0700   P.O. 1080   I.V. (mL/kg)    Other    IV Piggyback 250   Total Intake(mL/kg) 1330 (14.3)   Urine (mL/kg/hr) 1600 (1.4)   Drains    Total Output 1600   Net -270          Exam Gen: awake, resting comfortably in bed, NAD Ext:        Right Lower extremity               Dressing stable             vac machine functioning             Distal motor and sensory functions grossly intact               Ext warm    Assessment and Plan    POD/HD#: 2  60 y/o male s/p fall with 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, R leg compartments syndrome   1. 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, R leg compartments syndrome             NWB R leg             Aggressive ice and elevation of R leg for next 2 days             Return to OR tomorrow    Will attempt closure but Suspect we will perform STSG at that time    2. Pain management:             Continue with current regimen  3. ABL anemia/Hemodynamics             Check cbc in am   4. Medical issues               Home meds  5. DVT/PE prophylaxis:             lovenox   6. ID:               Scheduled abx for open fasciotomies   7. FEN/GI prophylaxis/Foley/Lines:             Diet as tolerated   Npo after MN   8. Dispo:             OR tomorrow     Mearl Latin, PA-C [facility] [phone] (O) 07/08/2015 7:27 PM

## 2015-07-08 NOTE — Care Management Note (Signed)
Case Management Note  Patient Details  Name: Joseph Jarvis MRN: [id number] Date of Birth: [birth year]  Subjective/Objective:                    Action/Plan: Patient to return to operating room Friday , will continue to follow for DC needs . VAC forms remain in shadow chart .   Expected Discharge Date:                  Expected Discharge Plan:  Home/Self Care  In-House Referral:     Discharge planning Services     Post Acute Care Choice:    Choice offered to:     DME Arranged:    DME Agency:     HH Arranged:    HH Agency:     Status of Service:  In process, will continue to follow  Medicare Important Message Given:    Date Medicare IM Given:    Medicare IM give by:    Date Additional Medicare IM Given:    Additional Medicare Important Message give by:     If discussed at Long Length of Stay Meetings, dates discussed:  07-08-15  Additional Comments:  Kingsley Plan, RN 07/08/2015, 11:22 AM

## 2015-07-09 ENCOUNTER — Inpatient Hospital Stay (HOSPITAL_COMMUNITY): Payer: Medicaid Other | Admitting: Anesthesiology

## 2015-07-09 ENCOUNTER — Encounter (HOSPITAL_COMMUNITY)
Admission: EM | Disposition: A | Discharge status: Home / Self Care | Payer: Self-pay | Source: Home / Self Care | Attending: Orthopedic Surgery

## 2015-07-09 HISTORY — PX: SKIN SPLIT GRAFT: SHX444

## 2015-07-09 LAB — CBC
HCT: 22 % — ABNORMAL LOW (ref 39.0–52.0)
HEMATOCRIT: 22.2 % — AB (ref 39.0–52.0)
HEMOGLOBIN: 7.5 g/dL — AB (ref 13.0–17.0)
HEMOGLOBIN: 7.4 g/dL — AB (ref 13.0–17.0)
MCH: 32.9 pg (ref 26.0–34.0)
MCH: 33.5 pg (ref 26.0–34.0)
MCHC: 33.6 g/dL (ref 30.0–36.0)
MCHC: 33.8 g/dL (ref 30.0–36.0)
MCV: 99.5 fL (ref 78.0–100.0)
MCV: 97.4 fL (ref 78.0–100.0)
PLATELETS: 250 10*3/uL (ref 150–400)
Platelets: 260 10*3/uL (ref 150–400)
RBC: 2.28 MIL/uL — AB (ref 4.22–5.81)
RBC: 2.21 MIL/uL — AB (ref 4.22–5.81)
RDW: 14.1 % (ref 11.5–15.5)
RDW: 14 % (ref 11.5–15.5)
WBC: 8.6 10*3/uL (ref 4.0–10.5)
WBC: 7.4 10*3/uL (ref 4.0–10.5)

## 2015-07-09 SURGERY — APPLICATION, GRAFT, SKIN, SPLIT-THICKNESS
Anesthesia: General | Laterality: Right

## 2015-07-09 MED ORDER — SUCCINYLCHOLINE CHLORIDE 20 MG/ML IJ SOLN
INTRAMUSCULAR | Status: DC | PRN
  Administered 2015-07-09: 120 mg via INTRAVENOUS

## 2015-07-09 MED ORDER — ROCURONIUM BROMIDE 50 MG/5ML IV SOLN
INTRAVENOUS | Status: AC
  Filled 2015-07-09: qty 1

## 2015-07-09 MED ORDER — PROPOFOL 10 MG/ML IV BOLUS
INTRAVENOUS | Status: AC
  Filled 2015-07-09: qty 20

## 2015-07-09 MED ORDER — PHENYLEPHRINE 40 MCG/ML (10ML) SYRINGE FOR IV PUSH (FOR BLOOD PRESSURE SUPPORT)
PREFILLED_SYRINGE | INTRAVENOUS | Status: AC
  Filled 2015-07-09: qty 20

## 2015-07-09 MED ORDER — DEXAMETHASONE SODIUM PHOSPHATE 4 MG/ML IJ SOLN
INTRAMUSCULAR | Status: DC | PRN
  Administered 2015-07-09: 4 mg via INTRAVENOUS

## 2015-07-09 MED ORDER — FENTANYL CITRATE (PF) 250 MCG/5ML IJ SOLN
INTRAMUSCULAR | Status: DC | PRN
  Administered 2015-07-09 (×5): 50 ug via INTRAVENOUS

## 2015-07-09 MED ORDER — SUGAMMADEX SODIUM 200 MG/2ML IV SOLN
INTRAVENOUS | Status: DC | PRN
  Administered 2015-07-09: 400 mg via INTRAVENOUS

## 2015-07-09 MED ORDER — PROPOFOL 10 MG/ML IV BOLUS
INTRAVENOUS | Status: DC | PRN
  Administered 2015-07-09: 160 mg via INTRAVENOUS

## 2015-07-09 MED ORDER — BUPIVACAINE-EPINEPHRINE (PF) 0.5% -1:200000 IJ SOLN
INTRAMUSCULAR | Status: AC
  Filled 2015-07-09: qty 30

## 2015-07-09 MED ORDER — FENTANYL CITRATE (PF) 250 MCG/5ML IJ SOLN
INTRAMUSCULAR | Status: AC
  Filled 2015-07-09: qty 5

## 2015-07-09 MED ORDER — DEXAMETHASONE SODIUM PHOSPHATE 4 MG/ML IJ SOLN
INTRAMUSCULAR | Status: AC
  Filled 2015-07-09: qty 1

## 2015-07-09 MED ORDER — ROCURONIUM BROMIDE 100 MG/10ML IV SOLN
INTRAVENOUS | Status: DC | PRN
  Administered 2015-07-09: 50 mg via INTRAVENOUS

## 2015-07-09 MED ORDER — EPHEDRINE SULFATE 50 MG/ML IJ SOLN
INTRAMUSCULAR | Status: AC
  Filled 2015-07-09: qty 1

## 2015-07-09 MED ORDER — MINERAL OIL LIGHT 100 % EX OIL
TOPICAL_OIL | CUTANEOUS | Status: DC | PRN
  Administered 2015-07-09: 1 via TOPICAL

## 2015-07-09 MED ORDER — LIDOCAINE HCL (CARDIAC) 20 MG/ML IV SOLN
INTRAVENOUS | Status: DC | PRN
  Administered 2015-07-09: 100 mg via INTRAVENOUS

## 2015-07-09 MED ORDER — LACTATED RINGERS IV SOLN
INTRAVENOUS | Status: DC
  Administered 2015-07-09 (×3): via INTRAVENOUS

## 2015-07-09 MED ORDER — MINERAL OIL LIGHT 100 % EX OIL
TOPICAL_OIL | CUTANEOUS | Status: AC
  Filled 2015-07-09: qty 25

## 2015-07-09 MED ORDER — STERILE WATER FOR INJECTION IJ SOLN
INTRAMUSCULAR | Status: AC
  Filled 2015-07-09: qty 10

## 2015-07-09 MED ORDER — ONDANSETRON HCL 4 MG/2ML IJ SOLN
INTRAMUSCULAR | Status: DC | PRN
  Administered 2015-07-09: 4 mg via INTRAVENOUS

## 2015-07-09 MED ORDER — SUGAMMADEX SODIUM 500 MG/5ML IV SOLN
INTRAVENOUS | Status: AC
  Filled 2015-07-09: qty 5

## 2015-07-09 MED ORDER — LIDOCAINE HCL (CARDIAC) 20 MG/ML IV SOLN
INTRAVENOUS | Status: AC
  Filled 2015-07-09: qty 5

## 2015-07-09 MED ORDER — MIDAZOLAM HCL 5 MG/5ML IJ SOLN
INTRAMUSCULAR | Status: DC | PRN
  Administered 2015-07-09: 2 mg via INTRAVENOUS

## 2015-07-09 MED ORDER — PHENYLEPHRINE HCL 10 MG/ML IJ SOLN
10.0000 mg | INTRAVENOUS | Status: DC | PRN
  Administered 2015-07-09: 80 ug/min via INTRAVENOUS

## 2015-07-09 MED ORDER — PHENYLEPHRINE HCL 10 MG/ML IJ SOLN
INTRAMUSCULAR | Status: DC | PRN
  Administered 2015-07-09 (×4): 80 ug via INTRAVENOUS
  Administered 2015-07-09: 120 ug via INTRAVENOUS
  Administered 2015-07-09: 80 ug via INTRAVENOUS

## 2015-07-09 MED ORDER — MIDAZOLAM HCL 2 MG/2ML IJ SOLN
INTRAMUSCULAR | Status: AC
  Filled 2015-07-09: qty 2

## 2015-07-09 MED ORDER — 0.9 % SODIUM CHLORIDE (POUR BTL) OPTIME
TOPICAL | Status: DC | PRN
  Administered 2015-07-09: 1000 mL

## 2015-07-09 MED ORDER — SUCCINYLCHOLINE CHLORIDE 20 MG/ML IJ SOLN
INTRAMUSCULAR | Status: AC
  Filled 2015-07-09: qty 1

## 2015-07-09 MED ORDER — BUPIVACAINE-EPINEPHRINE 0.5% -1:200000 IJ SOLN
INTRAMUSCULAR | Status: DC | PRN
  Administered 2015-07-09: 30 mL

## 2015-07-09 SURGICAL SUPPLY — 64 items
BALL CTTN LRG ABS STRL LF (GAUZE/BANDAGES/DRESSINGS) ×1
BANDAGE ELASTIC 6 VELCRO ST LF (GAUZE/BANDAGES/DRESSINGS) ×4 IMPLANT
BLADE DERMATOME SS (BLADE) ×2 IMPLANT
BNDG COHESIVE 4X5 TAN STRL (GAUZE/BANDAGES/DRESSINGS) IMPLANT
BNDG GAUZE ELAST 4 BULKY (GAUZE/BANDAGES/DRESSINGS) IMPLANT
BRUSH SCRUB DISP (MISCELLANEOUS) ×4 IMPLANT
CANISTER SUCTION 2500CC (MISCELLANEOUS) IMPLANT
CANISTER WOUND CARE 500ML ATS (WOUND CARE) ×2 IMPLANT
COTTONBALL LRG STERILE PKG (GAUZE/BANDAGES/DRESSINGS) ×4 IMPLANT
COVER SURGICAL LIGHT HANDLE (MISCELLANEOUS) ×4 IMPLANT
DERMACARRIERS GRAFT 1 TO 1.5 (DISPOSABLE) ×3
DRAPE C-ARMOR (DRAPES) ×1 IMPLANT
DRAPE EXTREMITY T 121X128X90 (DRAPE) IMPLANT
DRAPE PROXIMA HALF (DRAPES) ×4 IMPLANT
DRAPE U-SHAPE 76X120 STRL (DRAPES) ×2 IMPLANT
DRSG ADAPTIC 3X8 NADH LF (GAUZE/BANDAGES/DRESSINGS) ×2 IMPLANT
DRSG MEPITEL 4X7.2 (GAUZE/BANDAGES/DRESSINGS) ×2 IMPLANT
DRSG PAD ABDOMINAL 8X10 ST (GAUZE/BANDAGES/DRESSINGS) ×4 IMPLANT
DRSG VAC ATS LRG SENSATRAC (GAUZE/BANDAGES/DRESSINGS) ×2 IMPLANT
ELECT REM PT RETURN 9FT ADLT (ELECTROSURGICAL) ×3
ELECTRODE REM PT RTRN 9FT ADLT (ELECTROSURGICAL) IMPLANT
GAUZE SPONGE 4X4 16PLY XRAY LF (GAUZE/BANDAGES/DRESSINGS) ×3 IMPLANT
GAUZE XEROFORM 5X9 LF (GAUZE/BANDAGES/DRESSINGS) IMPLANT
GLOVE BIO SURGEON STRL SZ7.5 (GLOVE) ×3 IMPLANT
GLOVE BIO SURGEON STRL SZ8 (GLOVE) ×3 IMPLANT
GLOVE BIOGEL PI IND STRL 7.0 (GLOVE) IMPLANT
GLOVE BIOGEL PI IND STRL 7.5 (GLOVE) ×1 IMPLANT
GLOVE BIOGEL PI IND STRL 8 (GLOVE) ×1 IMPLANT
GLOVE BIOGEL PI INDICATOR 7.0 (GLOVE) ×2
GLOVE BIOGEL PI INDICATOR 7.5 (GLOVE) ×4
GLOVE BIOGEL PI INDICATOR 8 (GLOVE) ×2
GOWN STRL REUS W/ TWL LRG LVL3 (GOWN DISPOSABLE) ×2 IMPLANT
GOWN STRL REUS W/ TWL XL LVL3 (GOWN DISPOSABLE) ×1 IMPLANT
GOWN STRL REUS W/TWL LRG LVL3 (GOWN DISPOSABLE) ×3
GOWN STRL REUS W/TWL XL LVL3 (GOWN DISPOSABLE) ×3
GRAFT DERMACARRIERS 1 TO 1.5 (DISPOSABLE) IMPLANT
HANDPIECE INTERPULSE COAX TIP (DISPOSABLE)
KIT BASIN OR (CUSTOM PROCEDURE TRAY) ×3 IMPLANT
KIT ROOM TURNOVER OR (KITS) ×3 IMPLANT
MANIFOLD NEPTUNE II (INSTRUMENTS) IMPLANT
NS IRRIG 1000ML POUR BTL (IV SOLUTION) ×3 IMPLANT
PACK GENERAL/GYN (CUSTOM PROCEDURE TRAY) ×3 IMPLANT
PAD ARMBOARD 7.5X6 YLW CONV (MISCELLANEOUS) ×6 IMPLANT
PAD CAST 4YDX4 CTTN HI CHSV (CAST SUPPLIES) ×1 IMPLANT
PADDING CAST COTTON 4X4 STRL (CAST SUPPLIES) ×3
PADDING CAST COTTON 6X4 STRL (CAST SUPPLIES) ×1 IMPLANT
PENCIL BUTTON HOLSTER BLD 10FT (ELECTRODE) ×3 IMPLANT
SET HNDPC FAN SPRY TIP SCT (DISPOSABLE) IMPLANT
SPONGE GAUZE 4X4 12PLY STER LF (GAUZE/BANDAGES/DRESSINGS) ×2 IMPLANT
SPONGE LAP 18X18 X RAY DECT (DISPOSABLE) ×7 IMPLANT
SPONGE SCRUB IODOPHOR (GAUZE/BANDAGES/DRESSINGS) ×3 IMPLANT
STAPLER VISISTAT (STAPLE) ×1 IMPLANT
STAPLER VISISTAT 35W (STAPLE) ×1 IMPLANT
STOCKINETTE IMPERVIOUS LG (DRAPES) IMPLANT
SUT ETHILON 2 0 PSLX (SUTURE) ×4 IMPLANT
SUT ETHILON 3 0 FSL (SUTURE) ×3 IMPLANT
SUT PROLENE 4 0 SH DA (SUTURE) ×4 IMPLANT
TOWEL OR 17X24 6PK STRL BLUE (TOWEL DISPOSABLE) ×3 IMPLANT
TOWEL OR 17X26 10 PK STRL BLUE (TOWEL DISPOSABLE) ×4 IMPLANT
TUBE CONNECTING 12'X1/4 (SUCTIONS)
TUBE CONNECTING 12X1/4 (SUCTIONS) IMPLANT
UNDERPAD 30X30 INCONTINENT (UNDERPADS AND DIAPERS) ×3 IMPLANT
WATER STERILE IRR 1000ML POUR (IV SOLUTION) ×3 IMPLANT
YANKAUER SUCT BULB TIP NO VENT (SUCTIONS) IMPLANT

## 2015-07-09 NOTE — Progress Notes (Signed)
Pharmacy Antibiotic Note  Joseph Jarvis is a 60 y.o. male admitted on 07/03/2015 with open wounds.  Pharmacy has been consulted for vancomycin dosing.  Plan: - Vanc  IV Q12H - Monitor renal fxn, clinical progress - 1330 VT Sat  Height:  (185.4 cm) Weight: 205 lb (92.987 kg) IBW/kg (Calculated) : 79.9  Temp (24hrs), Avg:97.7 F (36.5 C), Min:97.2 F (36.2 C), Max:98.5 F (36.9 C)   Recent Labs Lab 07/03/15 1818 07/05/15 0551 07/06/15 0745 07/07/15 0624 07/09/15 0441  WBC 11.5* 9.5 9.1  --  7.4  CREATININE 1.08  --   --  0.93  --     Estimated Creatinine Clearance: 94.3 mL/min (by C-G formula based on Cr of 0.93).    Allergies  Allergen Reactions  . Penicillins Swelling    Antimicrobials this admission: Vanc 2/21 >> Clinda 2/18 >> 2/19  Dose adjustments this admission: N/A  Microbiology results: MRSA PCR neg  Isaac Bliss, PharmD, BCPS, [facility] Clinical Pharmacist Pager [phone] 07/09/2015 12:44 PM

## 2015-07-09 NOTE — Anesthesia Postprocedure Evaluation (Signed)
Anesthesia Post Note  Patient: Joseph Jarvis  Procedure(s) Performed: Procedure(s) (LRB): RIGHT LEG SPLIT THICKNESS SKIN GRAFT (Right)  Patient location during evaluation: PACU Anesthesia Type: General Level of consciousness: sedated Pain management: pain level controlled Vital Signs Assessment: post-procedure vital signs reviewed and stable Respiratory status: spontaneous breathing and respiratory function stable Cardiovascular status: stable Anesthetic complications: no    Last Vitals:  Filed Vitals:   07/09/15 1023 07/09/15 1035  BP: 130/76   Pulse: 97   Temp:  36.2 C  Resp: 12     Last Pain:  Filed Vitals:   07/09/15 1038  PainSc: 0-No pain    LLE Motor Response: Purposeful movement;Responds to commands (07/09/15 1035) LLE Sensation: No numbness (07/09/15 1035) RLE Motor Response: Purposeful movement;Responds to commands (07/09/15 1035) RLE Sensation: No numbness (07/09/15 1035)      Trinetta Alemu DANIEL

## 2015-07-09 NOTE — Transfer of Care (Signed)
Immediate Anesthesia Transfer of Care Note  Patient: Joseph Jarvis  Procedure(s) Performed: Procedure(s): RIGHT LEG SPLIT THICKNESS SKIN GRAFT (Right)  Patient Location: PACU  Anesthesia Type:General  Level of Consciousness: awake, alert  and oriented  Airway & Oxygen Therapy: Patient Spontanous Breathing and Patient connected to nasal cannula oxygen  Post-op Assessment: Report given to RN, Post -op Vital signs reviewed and stable and Patient moving all extremities X 4  Post vital signs: Reviewed and stable  Last Vitals:  Filed Vitals:   07/08/15 2233 07/09/15 0455  BP: 131/57 115/65  Pulse: 105 94  Temp: 36.9 C 36.7 C  Resp: 18 18    Complications: No apparent anesthesia complications

## 2015-07-09 NOTE — Progress Notes (Signed)
Pt returned from pacu s/p left leg thigh graft. Unable to assess site as pt has compression wrap and wound vac. Pt is voiding using urinal

## 2015-07-09 NOTE — Anesthesia Procedure Notes (Signed)
Procedure Name: Intubation Date/Time: 07/09/2015 8:15 AM Performed by: Reine Just Pre-anesthesia Checklist: Patient identified, Emergency Drugs available, Suction available, Patient being monitored and Timeout performed Patient Re-evaluated:Patient Re-evaluated prior to inductionOxygen Delivery Method: Circle system utilized and Simple face mask Preoxygenation: Pre-oxygenation with 100% oxygen Intubation Type: IV induction Ventilation: Mask ventilation without difficulty Laryngoscope Size: Miller and 3 Grade View: Grade I Tube type: Oral Tube size: 7.5 mm Number of attempts: 1 Airway Equipment and Method: Patient positioned with wedge pillow and Stylet Placement Confirmation: ETT inserted through vocal cords under direct vision,  positive ETCO2 and breath sounds checked- equal and bilateral Secured at: 23 cm Tube secured with: Tape Dental Injury: Teeth and Oropharynx as per pre-operative assessment

## 2015-07-09 NOTE — Progress Notes (Signed)
Called for sign out-DR.Krista Blue states okay for pt to go back to room

## 2015-07-10 LAB — VANCOMYCIN, TROUGH: VANCOMYCIN TR: 16 ug/mL (ref 10.0–20.0)

## 2015-07-10 MED ORDER — VANCOMYCIN HCL IN DEXTROSE 1-5 GM/200ML-% IV SOLN
1000.0000 mg | Freq: Two times a day (BID) | INTRAVENOUS | Status: DC
  Administered 2015-07-11 – 2015-07-12 (×3): 1000 mg via INTRAVENOUS
  Filled 2015-07-10 (×4): qty 200

## 2015-07-10 NOTE — Progress Notes (Signed)
Pharmacy Antibiotic Note  Joseph Jarvis is a 60 y.o. male admitted on 07/03/2015 with open wounds.  Pharmacy has been consulted for vancomycin dosing. Pt is afebrile and WBC is WNL. A vancomycin trough was checked today and is 16 but was drawn >1 hour late so likely higher.   Plan: - Change vancomycin 1gm IV Q12H - F/u renal fxn, C&S, clinical status and trough at SS  Height:  (185.4 cm) Weight: 205 lb (92.987 kg) IBW/kg (Calculated) : 79.9  Temp (24hrs), Avg:98.2 F (36.8 C), Min:97.7 F (36.5 C), Max:98.6 F (37 C)   Recent Labs Lab 07/03/15 1818 07/05/15 0551 07/06/15 0745 07/07/15 0624 07/09/15 0441 07/09/15 2334 07/10/15 1440  WBC 11.5* 9.5 9.1  --  7.4 8.6  --   CREATININE 1.08  --   --  0.93  --   --   --   VANCOTROUGH  --   --   --   --   --   --  16    Estimated Creatinine Clearance: 94.3 mL/min (by C-G formula based on Cr of 0.93).    Allergies  Allergen Reactions  . Penicillins Swelling    Antimicrobials this admission: Vanc 2/21 >> Clinda 2/18 >> 2/19  Dose adjustments this admission: 2/25 Vanc dose changed from  mg Q12H to 1gm IV Q12H  Microbiology results: MRSA PCR neg  Isaac Bliss, PharmD, BCPS, [facility] Clinical Pharmacist Pager [phone] 07/10/2015 3:56 PM

## 2015-07-10 NOTE — Op Note (Signed)
NAMELOI, RENNAKER NO.:  [id number]  MEDICAL RECORD NO.:  [id number]  LOCATION:  6N07C                        FACILITY:  [facility]  PHYSICIAN:  Doralee Albino. Carola Frost, M.D. DATE OF BIRTH:  Aug 09, 1955  DATE OF PROCEDURE:  07/09/2015 DATE OF DISCHARGE:                              OPERATIVE REPORT   PREOPERATIVE DIAGNOSIS:  Open lateral wound fasciotomy status post compartment syndrome.  POSTOPERATIVE DIAGNOSIS:  Open lateral wound fasciotomy status post compartment syndrome.  PROCEDURE: 1. Split-thickness skin grafting, 22 cm x 8 cm. 2. Application of large wound VAC.  SURGEON:  Doralee Albino. Carola Frost, M.D.  ASSISTING:  Montez Morita, PA-C.  ANESTHESIA:  General.  COMPLICATIONS:  None.  DISPOSITION:  To PACU.  CONDITION:  Stable.  BRIEF SUMMARY OF INDICATIONS AND PROCEDURE:  Tyreece Gelles is a very pleasant gentleman with repair of a plateau and shaft fracture through fasciotomy wound with partial closure several days ago.  He now returns to the OR for repeat washout, possible layered closure versus split- thickness skin grafting.  I did discuss with him the risks and benefits of the procedure including potential for scarring, loss of motion, nerve injury, vessel injury, DVT, PE, and multiple others.  The patient acknowledged these risks and strongly wished to proceed.  BRIEF SUMMARY OF PROCEDURE:  The patient was taken to the operating room, where general anesthesia was induced.  His right lower extremity was prepped and draped in usual fashion.  I began by thoroughly irrigating the wound, and then attempting a stepwise closure.  The tissue was too edematous and the muscle too diffusely enlarged to allow for any sort of further closure.  Consequently, the wound was measured and found to be 22 cm x 8 cm and then skin graft obtained from the right thigh meshed and applied using a 4-0 Prolene suture.  A layer of Mepitel and then the large wound VAC applied over  this the donor site was dressed with medium Marcaine with epinephrine followed by 2 layers of Adaptic, and a gently compressive bulky dressing to firmly appose the Xeroform to the leg.  Montez Morita, PA-C, assisted me throughout.  The patient was awakened from anesthesia and transferred to PACU in stable condition.  PROGNOSIS:  The patient will be re-evaluated tomorrow with removal of the overlying dressing and placement of a fan.  The recipient site will then be checked with removal of the VAC on Sunday or Monday prior to discharge at which time, I will switch to Adaptic and gauze dressings. He is certainly at elevated risk of infection given the total knee and the associated compartment syndrome with his tibial plateau and shaft fractures as well as the need for plating through that fasciotomy site and early goings to remove the fixator.  The fractures have been discussed with the patient.  He is well aware of them.     Doralee Albino. Carola Frost, M.D.     MHH/MEDQ  D:  07/10/2015  T:  07/10/2015  Job:  [id number]

## 2015-07-10 NOTE — Progress Notes (Signed)
     Subjective:  Patient reports pain as moderate.  Resting comfortably in bed this morning.  Dressings were taken down.  Wound vac connected.  Dry dressings reapplied to RLE.  Graft site dressings were removed down to xeroform.  Fans set up next to site.  Patient instructed on how to blot bleeding with dry guaze.  Plan is for wound vac to be removed Monday with possible discharge then.   Objective:   VITALS:   Filed Vitals:   07/09/15 1353 07/09/15 2214 07/10/15 0054 07/10/15 0554  BP: 122/73 119/69 120/65 118/78  Pulse: 96 96 90 76  Temp: 97.6 F (36.4 C) 98.6 F (37 C) 98.6 F (37 C) 97.8 F (36.6 C)  TempSrc: Oral Oral Oral Oral  Resp: Height:      Weight:      SpO2: 95% 94% 98% 100%    Neurologically intact ABD soft Neurovascular intact Sensation intact distally Intact pulses distally Dorsiflexion/Plantar flexion intact Incision: moderate drainage Dressings all changed at bedside Wound vac to the Right calf Knee immobilizer removed while in bed to allow for graft site to dry with fan  Lab Results  Component Value Date   WBC 8.6 07/09/2015   HGB 7.5* 07/09/2015   HCT 22.2* 07/09/2015   MCV 97.4 07/09/2015   PLT 260 07/09/2015   BMET    Component Value Date/Time   NA 134* 07/07/2015 0624   K 4.2 07/07/2015 0624   CL 98* 07/07/2015 0624   CO2 31 07/07/2015 0624   GLUCOSE 114* 07/07/2015 0624   BUN 9 07/07/2015 0624   CREATININE 0.93 07/07/2015 0624   CALCIUM 7.9* 07/07/2015 0624   GFRNONAA >60 07/07/2015 0624   GFRAA >60 07/07/2015 9562     Assessment/Plan: 1 Day Post-Op   Principal Problem:   Closed fracture of proximal end of right tibia and fibula; compartment syndrome right leg Active Problems:   Closed fracture of right tibia and fibula  Dressing taken down to Xeroform.  Patient will continue to blot area with gauze for bleeding.  Fans set up next to site.  Wound vac connected to the R calf.  Plan is for removal on Monday by  Dr. Carola Frost.    Joseph Jarvis Joseph Jarvis 07/10/2015, 11:18 AM Cell [phone]

## 2015-07-11 LAB — BASIC METABOLIC PANEL
Anion gap: 7 (ref 5–15)
BUN: 10 mg/dL (ref 6–20)
CHLORIDE: 103 mmol/L (ref 101–111)
CO2: 29 mmol/L (ref 22–32)
CREATININE: 1.04 mg/dL (ref 0.61–1.24)
Calcium: 8.3 mg/dL — ABNORMAL LOW (ref 8.9–10.3)
Glucose, Bld: 108 mg/dL — ABNORMAL HIGH (ref 65–99)
POTASSIUM: 3.6 mmol/L (ref 3.5–5.1)
SODIUM: 139 mmol/L (ref 135–145)

## 2015-07-11 MED ORDER — POLYETHYLENE GLYCOL 3350 17 G PO PACK
17.0000 g | PACK | Freq: Every day | ORAL | Status: DC
  Administered 2015-07-11 – 2015-07-12 (×2): 17 g via ORAL
  Filled 2015-07-11 (×2): qty 1

## 2015-07-11 MED ORDER — SENNOSIDES-DOCUSATE SODIUM 8.6-50 MG PO TABS
1.0000 | ORAL_TABLET | Freq: Once | ORAL | Status: AC
  Administered 2015-07-11: 1 via ORAL
  Filled 2015-07-11: qty 1

## 2015-07-11 NOTE — Progress Notes (Signed)
     Subjective:  Patient reports pain as mild to moderate.  Resting comfortably in bed with the fans aimed at the graft site.  Very little drainage overnight.  Continues to blot area with dry gauze as needed.  Main complaint is constipation.  Will have a stool softener given.  Objective:   VITALS:   Filed Vitals:   07/10/15 0554 07/10/15 1415 07/10/15 2147 07/11/15 0508  BP: 118/78 123/64 124/85 114/63  Pulse: 76 90 72 82  Temp: 97.8 F (36.6 C) 97.7 F (36.5 C) 97.5 F (36.4 C) 98.1 F (36.7 C)  TempSrc: Oral Oral Oral Oral  Resp: Height:      Weight:      SpO2: 100% 99% 98% 97%    Neurologically intact ABD soft Neurovascular intact Sensation intact distally Intact pulses distally Dorsiflexion/Plantar flexion intact Incision: dressing C/D/I Wound vac to the R leg.   Graft harvest site scant drainage.  Xeroform dressing still intact.   Lab Results  Component Value Date   WBC 8.6 07/09/2015   HGB 7.5* 07/09/2015   HCT 22.2* 07/09/2015   MCV 97.4 07/09/2015   PLT 260 07/09/2015   BMET    Component Value Date/Time   NA 139 07/11/2015 0530   K 3.6 07/11/2015 0530   CL 103 07/11/2015 0530   CO2 29 07/11/2015 0530   GLUCOSE 108* 07/11/2015 0530   BUN 10 07/11/2015 0530   CREATININE 1.04 07/11/2015 0530   CALCIUM 8.3* 07/11/2015 0530   GFRNONAA >60 07/11/2015 0530   GFRAA >60 07/11/2015 0530     Assessment/Plan: 2 Days Post-Op   Principal Problem:   Closed fracture of proximal end of right tibia and fibula; compartment syndrome right leg Active Problems:   Closed fracture of right tibia and fibula   Up with therapy Patient will continue to keep fan on the graft site and blot with dry gauze as needed.  Will have RN give some medication to help with constipation. Passing gas but no BM since surgery.    Lynnleigh Soden Hilda Lias 07/11/2015, 10:04 AM Cell [phone]

## 2015-07-12 ENCOUNTER — Encounter (HOSPITAL_COMMUNITY): Payer: Self-pay | Admitting: Orthopedic Surgery

## 2015-07-12 DIAGNOSIS — T79A21A Traumatic compartment syndrome of right lower extremity, initial encounter: Secondary | ICD-10-CM

## 2015-07-12 DIAGNOSIS — I10 Essential (primary) hypertension: Secondary | ICD-10-CM | POA: Diagnosis present

## 2015-07-12 DIAGNOSIS — Z96659 Presence of unspecified artificial knee joint: Secondary | ICD-10-CM

## 2015-07-12 DIAGNOSIS — M978XXA Periprosthetic fracture around other internal prosthetic joint, initial encounter: Secondary | ICD-10-CM

## 2015-07-12 DIAGNOSIS — K219 Gastro-esophageal reflux disease without esophagitis: Secondary | ICD-10-CM | POA: Diagnosis present

## 2015-07-12 HISTORY — DX: Presence of unspecified artificial knee joint: M97.8XXA

## 2015-07-12 HISTORY — DX: Traumatic compartment syndrome of right lower extremity, initial encounter: T79.A21A

## 2015-07-12 MED ORDER — ASPIRIN EC 325 MG PO TBEC: 325.0000 mg | DELAYED_RELEASE_TABLET | Freq: Two times a day (BID) | ORAL | Status: DC

## 2015-07-12 MED ORDER — METHOCARBAMOL 500 MG PO TABS: 500.0000 mg | ORAL_TABLET | Freq: Four times a day (QID) | ORAL | Status: DC | PRN

## 2015-07-12 MED ORDER — OXYCODONE-ACETAMINOPHEN 5-325 MG PO TABS: 1.0000 | ORAL_TABLET | Freq: Four times a day (QID) | ORAL | Status: DC | PRN

## 2015-07-12 MED ORDER — OXYCODONE HCL 5 MG PO TABS: 5.0000 mg | ORAL_TABLET | Freq: Four times a day (QID) | ORAL | Status: DC | PRN

## 2015-07-12 NOTE — Evaluation (Signed)
Physical Therapy Evaluation Patient Details Name: Joseph Jarvis MRN: [id number] DOB: [birth year] Today's Date: 07/12/2015   History of Present Illness  60 yo male s/p fall at home with tibia fx, compartment syndrome s/p ex fix with removal, fasciotomy and skin graft. PMHx: R TKA, back pain, anxiety, GERD  Clinical Impression  Pt very pleasant and moving well. Pt educated for positioning with no pillow under knee and towel roll under right ankle end of session. Pt with decreased strength, gait and activity tolerance who will benefit from acute therapy to maximize mobility, independence and function to decrease burden of care. Pt educated for gait, stairs and HEP with encouragement for daily progression.     Follow Up Recommendations Home health PT    Equipment Recommendations  None recommended by PT    Recommendations for Other Services       Precautions / Restrictions Precautions Precautions: Fall Restrictions RLE Weight Bearing: Non weight bearing      Mobility  Bed Mobility Overal bed mobility: Modified Independent                Transfers Overall transfer level: Needs assistance   Transfers: Sit to/from Stand Sit to Stand: Supervision         General transfer comment: cues for hand placement and safety  Ambulation/Gait Ambulation/Gait assistance: Supervision Ambulation Distance (Feet): 30 Feet Assistive device: Rolling walker (2 wheeled) Gait Pattern/deviations: Step-to pattern   Gait velocity interpretation: Below normal speed for age/gender General Gait Details: cues for sequence. Pt walked 15' then 30' with seated rest and denied increased distance due to fatigue  Stairs Stairs: Yes Stairs assistance: Supervision Stair Management: Step to pattern;Backwards;With walker Number of Stairs: 1 General stair comments: cues for sequence  Wheelchair Mobility    Modified Rankin (Stroke Patients Only)       Balance                                              Pertinent Vitals/Pain Pain Assessment: 0-10 Pain Score: 5  Pain Location: right leg Pain Descriptors / Indicators: Aching Pain Intervention(s): Limited activity within patient's tolerance;Premedicated before session;Repositioned    Home Living Family/patient expects to be discharged to:: Private residence Living Arrangements: Alone Available Help at Discharge: Family;Available PRN/intermittently Type of Home: House Home Access: Stairs to enter Entrance Stairs-Rails: None Entrance Stairs-Number of Steps: 1 Home Layout: Two level;Able to live on main level with bedroom/bathroom Home Equipment: Dan Humphreys - 2 wheels;Cane - quad;Bedside commode;Shower seat      Prior Function Level of Independence: Independent               Hand Dominance        Extremity/Trunk Assessment   Upper Extremity Assessment: Overall WFL for tasks assessed           Lower Extremity Assessment: RLE deficits/detail RLE Deficits / Details: decreased knee ROM and strength secondary to pain    Cervical / Trunk Assessment: Normal  Communication   Communication: No difficulties  Cognition Arousal/Alertness: Awake/alert Behavior During Therapy: WFL for tasks assessed/performed Overall Cognitive Status: Within Functional Limits for tasks assessed                      General Comments      Exercises General Exercises - Lower Extremity Long Arc Quad: AROM;Seated;Right;15 reps Hip Flexion/Marching: AROM;Seated;Right;15 reps  Assessment/Plan    PT Assessment Patient needs continued PT services  PT Diagnosis Difficulty walking;Acute pain   PT Problem List Decreased strength;Decreased range of motion;Decreased activity tolerance;Pain;Decreased mobility  PT Treatment Interventions Gait training;DME instruction;Functional mobility training;Therapeutic activities;Therapeutic exercise;Patient/family education   PT Goals (Current goals can be found in the  Care Plan section) Acute Rehab PT Goals Patient Stated Goal: return home PT Goal Formulation: With patient Time For Goal Achievement: 07/19/15 Potential to Achieve Goals: Good    Frequency Min 3X/week   Barriers to discharge Decreased caregiver support      Co-evaluation               End of Session Equipment Utilized During Treatment: Gait belt Activity Tolerance: Patient tolerated treatment well Patient left: in chair;with call bell/phone within reach Nurse Communication: Mobility status;Weight bearing status         Time: 7829-5621 PT Time Calculation (min) (ACUTE ONLY): 22 min   Charges:   PT Evaluation $PT Eval Moderate Complexity: 1 Procedure     PT G CodesDelorse Lek 07/12/2015, 12:18 PM Delaney Meigs, PT [phone]

## 2015-07-12 NOTE — Discharge Instructions (Signed)
[facility] Discharge Instructions   General Discharge Instructions  WEIGHT BEARING STATUS: Nonweightbearing right leg  RANGE OF MOTION/ACTIVITY: unrestricted range of motion right knee   Wound Care: see below   Donor site instructions   DO NOT remove yellow xeroform layer for any reason  Pt to dab blood droplets as they form  Fan to xeroform layer to dry out site/sites DO NOT cover donor sites with any dressings, allow them to stay  exposed to air  Gently trim edges as they roll up  Recipient site instructions Dressing change for stsg recipient site as follows  adaptic, 4x4, kerlix and stump shrinker  Change q 2-3 days  Do not clean wound with any ointments, solutions, solvents    PAIN MEDICATION USE AND EXPECTATIONS  You have likely been given narcotic medications to help control your pain.  After a traumatic event that results in an fracture (broken bone) with or without surgery, it is ok to use narcotic pain medications to help control one's pain.  We understand that everyone responds to pain differently and each individual patient will be evaluated on a regular basis for the continued need for narcotic medications. Ideally, narcotic medication use should last no more than 6-8 weeks (coinciding with fracture healing).   As a patient it is your responsibility as well to monitor narcotic medication use and report the amount and frequency you use these medications when you come to your office visit.   We would also advise that if you are using narcotic medications, you should take a dose prior to therapy to maximize you participation.  IF YOU ARE ON NARCOTIC MEDICATIONS IT IS NOT PERMISSIBLE TO OPERATE A MOTOR VEHICLE (MOTORCYCLE/CAR/TRUCK/MOPED) OR HEAVY MACHINERY DO NOT MIX NARCOTICS WITH OTHER CNS (CENTRAL NERVOUS SYSTEM) DEPRESSANTS SUCH AS ALCOHOL  Diet: as you were eating previously.  Can use over the counter stool softeners and bowel preparations, such as  Miralax, to help with bowel movements.  Narcotics can be constipating.  Be sure to drink plenty of fluids    STOP SMOKING OR USING NICOTINE PRODUCTS!!!!  As discussed nicotine severely impairs your body's ability to heal surgical and traumatic wounds but also impairs bone healing.  Wounds and bone heal by forming microscopic blood vessels (angiogenesis) and nicotine is a vasoconstrictor (essentially, shrinks blood vessels).  Therefore, if vasoconstriction occurs to these microscopic blood vessels they essentially disappear and are unable to deliver necessary nutrients to the healing tissue.  This is one modifiable factor that you can do to dramatically increase your chances of healing your injury.    (This means no smoking, no nicotine gum, patches, etc)  DO NOT USE NONSTEROIDAL ANTI-INFLAMMATORY DRUGS (NSAID'S)  Using products such as Advil (ibuprofen), Aleve (naproxen), Motrin (ibuprofen) for additional pain control during fracture healing can delay and/or prevent the healing response.  If you would like to take over the counter (OTC) medication, Tylenol (acetaminophen) is ok.  However, some narcotic medications that are given for pain control contain acetaminophen as well. Therefore, you should not exceed more than 4000 mg of tylenol in a day if you do not have liver disease.  Also note that there are may OTC medicines, such as cold medicines and allergy medicines that my contain tylenol as well.  If you have any questions about medications and/or interactions please ask your doctor/PA or your pharmacist.      ICE AND ELEVATE INJURED/OPERATIVE EXTREMITY  Using ice and elevating the injured extremity above your heart can help  with swelling and pain control.  Icing in a pulsatile fashion, such as 20 minutes on and 20 minutes off, can be followed.    Do not place ice directly on skin. Make sure there is a barrier between to skin and the ice pack.    Using frozen items such as frozen peas works well as  the conform nicely to the are that needs to be iced.  USE AN ACE WRAP OR TED HOSE FOR SWELLING CONTROL  In addition to icing and elevation, Ace wraps or TED hose are used to help limit and resolve swelling.  It is recommended to use Ace wraps or TED hose until you are informed to stop.    When using Ace Wraps start the wrapping distally (farthest away from the body) and wrap proximally (closer to the body)   Example: If you had surgery on your leg or thing and you do not have a splint on, start the ace wrap at the toes and work your way up to the thigh        If you had surgery on your upper extremity and do not have a splint on, start the ace wrap at your fingers and work your way up to the upper arm  IF YOU ARE IN A SPLINT OR CAST DO NOT REMOVE IT FOR ANY REASON   If your splint gets wet for any reason please contact the office immediately. You may shower in your splint or cast as long as you keep it dry.  This can be done by wrapping in a cast cover or garbage back (or similar)  Do Not stick any thing down your splint or cast such as pencils, money, or hangers to try and scratch yourself with.  If you feel itchy take benadryl as prescribed on the bottle for itching  IF YOU ARE IN A CAM BOOT (BLACK BOOT)  You may remove boot periodically. Perform daily dressing changes as noted below.  Wash the liner of the boot regularly and wear a sock when wearing the boot. It is recommended that you sleep in the boot until told otherwise  CALL THE OFFICE WITH ANY QUESTIONS OR CONCERTS: [phone]

## 2015-07-12 NOTE — Progress Notes (Signed)
Wray Kearns to be D/C'd  per MD order. Discussed with the patient and all questions fully answered.   IV catheter discontinued intact. Site without signs and symptoms of complications. Dressing and pressure applied.  An After Visit Summary was printed and given to the patient. Patient received prescription.  D/c education completed with patient/family including follow up instructions, medication list, d/c activities limitations if indicated, with other d/c instructions as indicated by MD - patient able to verbalize understanding, all questions fully answered.   Patient instructed to return to ED, call 911, or call MD for any changes in condition.   Patient to be escorted via WC, and D/C home via private auto.

## 2015-07-12 NOTE — Progress Notes (Signed)
[facility] Progress Note  Subjective  Doing great  Ready to go home  Pain 4-5/10 Had BM   Review of Systems  Constitutional: Negative for fever and chills.  Respiratory: Negative for shortness of breath and wheezing.   Cardiovascular: Negative for chest pain and palpitations.  Gastrointestinal: Negative for nausea, vomiting and abdominal pain.  Neurological: Negative for tingling, sensory change and headaches.     Objective   BP 114/62 mmHg  Pulse 79  Temp(Src) 98.4 F (36.9 C) (Oral)  Resp 17  Ht  (1.854 m)  Wt 92.987 kg (205 lb)  BMI 27.05 kg/m2  SpO2 97%  Intake/Output      02/26 0701 - 02/27 0700 02/27 0701 - 02/28 0700   P.O. 960 340   Other 100    Total Intake(mL/kg) 1060 (11.4) 340 (3.7)   Urine (mL/kg/hr) 4550 (2) 300 (1)   Drains 50 (0)    Stool  0 (0)   Total Output 4600 300   Net -3540 +40        Stool Occurrence  1 x     Labs  No new labs   Exam  Gen: awake, alert, NAD, comfortable Lungs: breathing unlabored Cardiac: RRR Abd: + BS, NTND Ext:       Right Lower Extremity   Donor site looks excellent  Xeroform stable  Vac removed from R lower leg  Recipient site looks fantastic  Graft appears to be taking  No signs of infection   Motor and sensory functions grossly intact  Ext warm  + DP pulse   No DCT  Swelling stable     Assessment and Plan   POD/HD#: 81   60 y/o male s/p fall with 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, R leg compartments syndrome   1. 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, R leg compartments syndrome             NWB R leg             unrestricted ROM R knee  HEP   No pillows under knee when at rest, place under ankle to get full extension   Leave donor site open to air   Donor site instructions      DO NOT remove yellow xeroform layer for any reason     Pt to dab blood droplets as they form     Fan to xeroform layer to dry out site/sites    DO  NOT cover donor sites with any dressings, allow them to stay  exposed to air     Gently trim edges as they roll up    Recipient site instructions     Dressing change for stsg recipient site as follows    adaptic, 4x4, kerlix and stump shrinker    Change q 2-3 days    Do not clean wound with any ointments, solutions, solvents   2. Pain management:             Continue with current regimen  3. ABL anemia/Hemodynamics             vitals stable   4. Medical issues               Home meds  5. DVT/PE prophylaxis:             lovenox while inpatient  Dc home on ASA 325 mg po BID   6. ID:  completed periop abx    7. FEN/GI prophylaxis/Foley/Lines:             Diet as tolerated                8. Dispo:           dc home today  Follow up at office in 7 days     Mearl Latin, PA-C [facility] [phone] (O) 07/12/2015 10:13 AM

## 2015-07-12 NOTE — Discharge Summary (Signed)
Orthopaedic Trauma Service (OTS)  Patient ID: Joseph Jarvis MRN: [id number] DOB/AGE: 1955-07-19 60 y.o.  Admit date: 07/03/2015 Discharge date: 07/12/2015  Admission Diagnoses: R periprosthetic proximal tibia fracture Acute compartment syndrome R lower leg GERD HTN   Discharge Diagnoses:  Principal Problem:   Periprosthetic fracture of proximal end of tibia, right Active Problems:   Closed fracture of right tibia and fibula   GERD (gastroesophageal reflux disease)   Hypertension   Compartment syndrome of right lower extremity ([place])   Procedures Performed: 07/03/2015- Dr. Ninfa Linden  1. Four-compartment fasciotomies, right lower extremity. 2. External fixation spanning, the right knee. 3. Placement of dicubo-VAC sponge, right medial and lateral fasciotomy     wounds.  07/06/2015- Dr. Marcelino Scot 1. Open reduction and internal fixation of tibial plateau. 2. Open reduction and internal fixation of tibial shaft fracture. 3. Removal of external fixator under anesthesia. 4. Complex layered closure medial fasciotomy, 16cm 5. Application of large wound VAC  07/09/2015- Dr. Marcelino Scot 1. Split-thickness skin grafting, 22 cm x 8 cm. 2. Application of large wound VAC.   Discharged Condition: good  Hospital Course:   Patient admitted on 07/03/2015 after sustaining a fall off a porch. Patient really denies pain. To hospital for evaluation right periprosthetic proximal tibia fracture. Patient was seen and evaluated by Dr. Ninfa Linden initially. At the time of the evaluation he was found to have acute compartment syndrome. Patient was taken emergently to the operating room for 4 compartment fasciotomies as well as spanning external fixation of his right leg. Due to the complexity of the injury the orthopedic trauma service was consult for definitive management. Patient was seen and evaluated by the orthopedic trauma service on 07/06/2015 and was taken to the operating room for reevaluation.  given  the extent of soft tissue injury and decision was made to perform fixation of his fracture at that time as we felt that patient would need skin grafting later on. This was performed without any difficulty on 07/06/2015. Patient was taken back to the operating room on 07/09/2015 for split-thickness skin graft. Patient's hospital stay was uncomplicated. No perioperative issues. It was long hospital stay due to the fact that he has severe soft tissue injury and required serial surgical procedures. The patient was eventually discharged on 07/12/2015 in stable condition. Patient was treated with Lovenox for DVT and PE prophylaxis. He was covered with vancomycin for his open fasciotomies. Patient discharged in stable condition. Time of discharge patient was tolerating a diet and voiding without difficulty and having bowel movement prior to discharge    Consults: None  Significant Diagnostic Studies: labs:   Results for Joseph Jarvis, Joseph Jarvis (MRN [id number]) as of 07/12/2015 10:54  Ref. Range 07/11/2015 05:30  Sodium Latest Ref Range: 135-145 mmol/L 139  Potassium Latest Ref Range: 3.5-5.1 mmol/L 3.6  Chloride Latest Ref Range: 101-111 mmol/L 103  CO2 Latest Ref Range: 22-32 mmol/L 29  BUN Latest Ref Range: 6-20 mg/dL 10  Creatinine Latest Ref Range: 0.61-1.24 mg/dL 1.04  Calcium Latest Ref Range: 8.9-10.3 mg/dL 8.3 (L)  EGFR (Non-African Amer.) Latest Ref Range: >60 mL/min >60  EGFR (African American) Latest Ref Range: >60 mL/min >60  Glucose Latest Ref Range: 65-99 mg/dL 108 (H)  Anion gap Latest Ref Range: 5-15  7    Treatments: IV hydration, antibiotics: vancomycin, analgesia: acetaminophen, Dilaudid, oxy IR, perococet, anticoagulation: ASA and LMW heparin, therapies: PT, OT and RN and surgery: as above   Discharge Exam:  Orthopaedic Trauma Service Progress  Note  Subjective  Doing great   Ready to go home   Pain 4-5/10 Had BM   Review of Systems  Constitutional: Negative for fever and  chills.  Respiratory: Negative for shortness of breath and wheezing.   Cardiovascular: Negative for chest pain and palpitations.  Gastrointestinal: Negative for nausea, vomiting and abdominal pain.  Neurological: Negative for tingling, sensory change and headaches.     Objective   BP 114/62 mmHg  Pulse 79  Temp(Src) 98.4 F (36.9 C) (Oral)  Resp 17  Ht _0  (1.854 m)  Wt 92.987 kg (205 lb)  BMI 27.05 kg/m2  SpO2 97%  Intake/Output       02/26 0701 - 02/27 0700 02/27 0701 - 02/28 0700    P.O. 960 340    Other 100     Total Intake(mL/kg) 1060 (11.4) 340 (3.7)    Urine (mL/kg/hr) 4550 (2) 300 (1)    Drains 50 (0)     Stool  0 (0)    Total Output 4600 300    Net -3540 +40          Stool Occurrence  1 x      Labs  No new labs   Exam  Gen: awake, alert, NAD, comfortable Lungs: breathing unlabored Cardiac: RRR Abd: + BS, NTND Ext:        Right Lower Extremity               Donor site looks excellent             Xeroform stable             Vac removed from R lower leg             Recipient site looks fantastic             Graft appears to be taking             No signs of infection               Motor and sensory functions grossly intact             Ext warm             + DP pulse               No DCT             Swelling stable      Assessment and Plan    POD/HD#: 20   60 y/o male s/p fall with 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, R leg compartment syndrome   1. 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, R leg compartment syndrome             NWB R leg             unrestricted ROM R knee             HEP               No pillows under knee when at rest, place under ankle to get full extension               Leave donor site open to air                         Donor site instructions  DO NOT remove yellow xeroform layer for any reason                                       Pt to dab  blood droplets as they form                                       Fan to xeroform layer to dry out site/sites                                     DO NOT cover donor sites with any dressings, allow them to stay             exposed to air                                       Gently trim edges as they roll up                          Recipient site instructions                                       Dressing change for stsg recipient site as follows                                     adaptic, 4x4, kerlix and stump shrinker                                     Change q 2-3 days                                     Do not clean wound with any ointments, solutions, solvents   2. Pain management:             Continue with current regimen  3. ABL anemia/Hemodynamics             vitals stable   4. Medical issues               Home meds  5. DVT/PE prophylaxis:             lovenox while inpatient             Dc home on ASA 325 mg po BID   6. ID:               completed periop abx    7. FEN/GI prophylaxis/Foley/Lines:             Diet as tolerated                8. Dispo:           dc home today             Follow up at office in 7  days     Disposition: 06-Home-Health Care Svc  Discharge Instructions    Call MD / Call 911    Complete by:  As directed   If you experience chest pain or shortness of breath, CALL 911 and be transported to the hospital emergency room.  If you develope a fever above 101 F, pus (white drainage) or increased drainage or redness at the wound, or calf pain, call your surgeon's office.     Constipation Prevention    Complete by:  As directed   Drink plenty of fluids.  Prune juice may be helpful.  You may use a stool softener, such as Colace (over the counter) 100 mg twice a day.  Use MiraLax (over the counter) for constipation as needed.     Diet - low sodium heart healthy    Complete by:  As directed      Discharge instructions    Complete by:  As directed    Orthopaedic Trauma Service Discharge Instructions   General Discharge Instructions  WEIGHT BEARING STATUS: Nonweightbearing right leg  RANGE OF MOTION/ACTIVITY: unrestricted range of motion right knee   Wound Care: see below   Donor site instructions   DO NOT remove yellow xeroform layer for any reason  Pt to dab blood droplets as they form  Fan to xeroform layer to dry out site/sites DO NOT cover donor sites with any dressings, allow them to stay  exposed to air  Gently trim edges as they roll up  Recipient site instructions Dressing change for stsg recipient site as follows  adaptic, 4x4, kerlix and stump shrinker  Change q 2-3 days  Do not clean wound with any ointments, solutions, solvents    PAIN MEDICATION USE AND EXPECTATIONS  You have likely been given narcotic medications to help control your pain.  After a traumatic event that results in an fracture (broken bone) with or without surgery, it is ok to use narcotic pain medications to help control one's pain.  We understand that everyone responds to pain differently and each individual patient will be evaluated on a regular basis for the continued need for narcotic medications. Ideally, narcotic medication use should last no more than 6-8 weeks (coinciding with fracture healing).   As a patient it is your responsibility as well to monitor narcotic medication use and report the amount and frequency you use these medications when you come to your office visit.   We would also advise that if you are using narcotic medications, you should take a dose prior to therapy to maximize you participation.  IF YOU ARE ON NARCOTIC MEDICATIONS IT IS NOT PERMISSIBLE TO OPERATE A MOTOR VEHICLE (MOTORCYCLE/CAR/TRUCK/MOPED) OR HEAVY MACHINERY DO NOT MIX NARCOTICS WITH OTHER CNS (CENTRAL NERVOUS SYSTEM) DEPRESSANTS SUCH AS ALCOHOL  Diet: as you were eating previously.  Can use over the counter stool softeners and bowel preparations, such as  Miralax, to help with bowel movements.  Narcotics can be constipating.  Be sure to drink plenty of fluids    STOP SMOKING OR USING NICOTINE PRODUCTS!!!!  As discussed nicotine severely impairs your body's ability to heal surgical and traumatic wounds but also impairs bone healing.  Wounds and bone heal by forming microscopic blood vessels (angiogenesis) and nicotine is a vasoconstrictor (essentially, shrinks blood vessels).  Therefore, if vasoconstriction occurs to these microscopic blood vessels they essentially disappear and are unable to deliver necessary nutrients to the healing tissue.  This is one modifiable factor that you can do to dramatically  increase your chances of healing your injury.    (This means no smoking, no nicotine gum, patches, etc)  DO NOT USE NONSTEROIDAL ANTI-INFLAMMATORY DRUGS (NSAID'S)  Using products such as Advil (ibuprofen), Aleve (naproxen), Motrin (ibuprofen) for additional pain control during fracture healing can delay and/or prevent the healing response.  If you would like to take over the counter (OTC) medication, Tylenol (acetaminophen) is ok.  However, some narcotic medications that are given for pain control contain acetaminophen as well. Therefore, you should not exceed more than 4000 mg of tylenol in a day if you do not have liver disease.  Also note that there are may OTC medicines, such as cold medicines and allergy medicines that my contain tylenol as well.  If you have any questions about medications and/or interactions please ask your doctor/PA or your pharmacist.      ICE AND ELEVATE INJURED/OPERATIVE EXTREMITY  Using ice and elevating the injured extremity above your heart can help with swelling and pain control.  Icing in a pulsatile fashion, such as 20 minutes on and 20 minutes off, can be followed.    Do not place ice directly on skin. Make sure there is a barrier between to skin and the ice pack.    Using frozen items such as frozen peas works well as  the conform nicely to the are that needs to be iced.  USE AN ACE WRAP OR TED HOSE FOR SWELLING CONTROL  In addition to icing and elevation, Ace wraps or TED hose are used to help limit and resolve swelling.  It is recommended to use Ace wraps or TED hose until you are informed to stop.    When using Ace Wraps start the wrapping distally (farthest away from the body) and wrap proximally (closer to the body)   Example: If you had surgery on your leg or thing and you do not have a splint on, start the ace wrap at the toes and work your way up to the thigh        If you had surgery on your upper extremity and do not have a splint on, start the ace wrap at your fingers and work your way up to the upper arm  IF YOU ARE IN A SPLINT OR CAST DO NOT [place]   If your splint gets wet for any reason please contact the office immediately. You may shower in your splint or cast as long as you keep it dry.  This can be done by wrapping in a cast cover or garbage back (or similar)  Do Not stick any thing down your splint or cast such as pencils, money, or hangers to try and scratch yourself with.  If you feel itchy take benadryl as prescribed on the bottle for itching  IF YOU ARE IN A CAM BOOT (BLACK BOOT)  You may remove boot periodically. Perform daily dressing changes as noted below.  Wash the liner of the boot regularly and wear a sock when wearing the boot. It is recommended that you sleep in the boot until told otherwise  CALL THE OFFICE WITH ANY QUESTIONS OR CONCERTS: [phone]     Do not put a pillow under the knee. Place it under the heel.    Complete by:  As directed      Driving restrictions    Complete by:  As directed   No driving until further notice     Increase activity slowly as tolerated  Complete by:  As directed      Non weight bearing    Complete by:  As directed   Laterality:  right  Extremity:  Lower            Medication List    TAKE these medications         ALPRAZolam 1 MG tablet  Commonly known as:  XANAX  Take 1 mg by mouth 3 (three) times daily as needed for anxiety.     aspirin EC 325 MG tablet  Take 1 tablet (325 mg total) by mouth 2 (two) times daily after a meal.     losartan-hydrochlorothiazide 50-12.5 MG tablet  Commonly known as:  HYZAAR  Take 1 tablet by mouth daily.     methocarbamol 500 MG tablet  Commonly known as:  ROBAXIN  Take 1-2 tablets (500-1,000 mg total) by mouth every 6 (six) hours as needed for muscle spasms.     omeprazole 20 MG tablet  Commonly known as:  PRILOSEC OTC  Take 20 mg by mouth daily as needed (for heartburn).     oxyCODONE 5 MG immediate release tablet  Commonly known as:  Oxy IR/ROXICODONE  Take 1-3 tablets (5-15 mg total) by mouth every 6 (six) hours as needed for breakthrough pain (take between percocet for breaththrough pain).     oxyCODONE-acetaminophen 5-325 MG tablet  Commonly known as:  PERCOCET/ROXICET  Take 1-2 tablets by mouth every 6 (six) hours as needed.     PARoxetine 40 MG tablet  Commonly known as:  PAXIL  Take 40 mg by mouth every morning.     traZODone 100 MG tablet  Commonly known as:  DESYREL  Take 100 mg by mouth at bedtime as needed for sleep.           Follow-up Information    Follow up with HANDY,MICHAEL H, MD. Schedule an appointment as soon as possible for a visit in 7 days.   Specialty:  Orthopedic Surgery   Why:  For wound re-check   Contact information:   [address] [place] [address] [phone]       Discharge Instructions and Plan:  59 y/o male s/p fall with 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, R leg compartment syndrome   1. 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, R leg compartments syndrome             NWB R leg             unrestricted ROM R knee             HEP               No pillows under knee when at rest, place under ankle to get full extension               Leave  donor site open to air                         Donor site instructions                                      DO NOT remove yellow xeroform layer for any reason  Pt to dab blood droplets as they form                                       Fan to xeroform layer to dry out site/sites                                     DO NOT cover donor sites with any dressings, allow them to stay             exposed to air                                       Gently trim edges as they roll up                          Recipient site instructions                                       Dressing change for stsg recipient site as follows                                     adaptic, 4x4, kerlix and stump shrinker                                     Change q 2-3 days                                     Do not clean wound with any ointments, solutions, solvents   2. Pain management:             Continue with current regimen  3. ABL anemia/Hemodynamics             vitals stable   4. Medical issues               Home meds  5. DVT/PE prophylaxis:             lovenox while inpatient             Dc home on ASA 325 mg po BID   6. ID:               completed periop abx    7. FEN/GI prophylaxis/Foley/Lines:             Diet as tolerated                8. Dispo:           dc home today             Follow up at office in 7 days   Signed:  Jari Pigg, PA-C [facility] [phone] (P) 07/12/2015, 10:30 AM

## 2017-03-28 ENCOUNTER — Encounter (HOSPITAL_COMMUNITY): Payer: Self-pay | Admitting: Emergency Medicine

## 2017-03-28 ENCOUNTER — Emergency Department (HOSPITAL_COMMUNITY): Payer: Medicaid Other

## 2017-03-28 ENCOUNTER — Emergency Department (HOSPITAL_COMMUNITY)
Admission: EM | Admit: 2017-03-28 | Discharge: 2017-03-28 | Disposition: A | Discharge status: Home / Self Care | Payer: Medicaid Other | Attending: Emergency Medicine | Admitting: Emergency Medicine

## 2017-03-28 DIAGNOSIS — Y929 Unspecified place or not applicable: Secondary | ICD-10-CM | POA: Diagnosis not present

## 2017-03-28 DIAGNOSIS — Z79899 Other long term (current) drug therapy: Secondary | ICD-10-CM | POA: Insufficient documentation

## 2017-03-28 DIAGNOSIS — Y9301 Activity, walking, marching and hiking: Secondary | ICD-10-CM | POA: Insufficient documentation

## 2017-03-28 DIAGNOSIS — I10 Essential (primary) hypertension: Secondary | ICD-10-CM | POA: Diagnosis not present

## 2017-03-28 DIAGNOSIS — S91115A Laceration without foreign body of left lesser toe(s) without damage to nail, initial encounter: Secondary | ICD-10-CM | POA: Insufficient documentation

## 2017-03-28 DIAGNOSIS — W268XXA Contact with other sharp object(s), not elsewhere classified, initial encounter: Secondary | ICD-10-CM | POA: Diagnosis not present

## 2017-03-28 DIAGNOSIS — F17210 Nicotine dependence, cigarettes, uncomplicated: Secondary | ICD-10-CM | POA: Insufficient documentation

## 2017-03-28 DIAGNOSIS — Y999 Unspecified external cause status: Secondary | ICD-10-CM | POA: Diagnosis not present

## 2017-03-28 DIAGNOSIS — T1490XA Injury, unspecified, initial encounter: Secondary | ICD-10-CM

## 2017-03-28 DIAGNOSIS — Z7982 Long term (current) use of aspirin: Secondary | ICD-10-CM | POA: Insufficient documentation

## 2017-03-28 MED ORDER — OXYCODONE-ACETAMINOPHEN 5-325 MG PO TABS
1.0000 | ORAL_TABLET | Freq: Once | ORAL | Status: AC
  Administered 2017-03-28: 1 via ORAL
  Filled 2017-03-28: qty 1

## 2017-03-28 MED ORDER — LIDOCAINE HCL (PF) 2 % IJ SOLN
2.0000 mL | Freq: Once | INTRAMUSCULAR | Status: AC
  Administered 2017-03-28: 2 mL via INTRADERMAL

## 2017-03-28 MED ORDER — LIDOCAINE HCL (PF) 2 % IJ SOLN
INTRAMUSCULAR | Status: AC
  Filled 2017-03-28: qty 10

## 2017-03-28 MED ORDER — SULFAMETHOXAZOLE-TRIMETHOPRIM 800-160 MG PO TABS: 1.0000 | ORAL_TABLET | Freq: Two times a day (BID) | ORAL | 0 refills | Status: AC

## 2017-03-28 NOTE — Discharge Instructions (Signed)
Keep the wound clean with mild soap and water and keep it bandaged.  Sutures out in 8-10 days. Return here for any signs of infection

## 2017-03-28 NOTE — ED Triage Notes (Signed)
PT states he walked into a chainsaw that was sitting in the floor this am. PT states he went to his PCP this am and was told to come to ED for xrays and sutures.

## 2017-03-28 NOTE — ED Notes (Signed)
Pt stepped on a chain saw cutting his L foot Went to his PCP who had no radiology equipment and sent pt here

## 2017-03-28 NOTE — ED Notes (Signed)
To Radiology

## 2017-03-28 NOTE — ED Provider Notes (Signed)
[facility] EMERGENCY DEPARTMENT Provider Note   CSN: [id number] Arrival date & time: 03/28/17  1139     History   Chief Complaint Chief Complaint  Patient presents with  . Laceration    HPI Joseph Jarvis is a 61 y.o. male.  HPI   Joseph Jarvis is a 61 y.o. male who presents to the Emergency Department complaining of laceration to the left third toe.  He states that he woke up this morning to go to the restroom when he stepped on a chain saw that was lying in the floor.  He states that he did not fall but struck his toe on the blade of the saw which caused a laceration.  He went to his PCP this morning but was advised to come to the emergency room because they were unable to x-ray his toe and were concerned about a fracture.  Immunizations are current.  He denies use of blood thinners, numbness or weakness of the extremity, and discoloration.   Past Medical History:  Diagnosis Date  . Arthritis   . Back pain   . Compartment syndrome of right lower extremity (HCC) 07/12/2015   lower leg   . GERD (gastroesophageal reflux disease)   . Hypertension   . Panic attack   . Periprosthetic fracture of proximal end of tibia, right 07/12/2015   right    Patient Active Problem List   Diagnosis Date Noted  . Periprosthetic fracture of proximal end of tibia, right 07/12/2015  . Compartment syndrome of right lower extremity (HCC) 07/12/2015  . GERD (gastroesophageal reflux disease)   . Hypertension   . Closed fracture of proximal end of right tibia and fibula; compartment syndrome right leg 07/03/2015  . Closed fracture of right tibia and fibula 07/03/2015  . Osteoarthritis of right knee 10/20/2013    Past Surgical History:  Procedure Laterality Date  . ORIF TIBIA FRACTURE Right 07/03/2015       Home Medications    Prior to Admission medications   Medication Sig Start Date End Date Taking? Authorizing Provider  ALPRAZolam Prudy Feeler) 1 MG tablet Take 1 mg by mouth 3  (three) times daily as needed for anxiety.    [provider]  aspirin EC 325 MG tablet Take 1 tablet (325 mg total) by mouth 2 (two) times daily after a meal. 07/12/15   Montez Morita, PA-C  losartan-hydrochlorothiazide (HYZAAR) 50-12.5 MG tablet Take 1 tablet by mouth daily. 04/12/15   [provider]  methocarbamol (ROBAXIN) 500 MG tablet Take 1-2 tablets (500-1,000 mg total) by mouth every 6 (six) hours as needed for muscle spasms. 07/12/15   Montez Morita, PA-C  omeprazole (PRILOSEC OTC) 20 MG tablet Take 20 mg by mouth daily as needed (for heartburn).    [provider]  oxyCODONE (OXY IR/ROXICODONE) 5 MG immediate release tablet Take 1-3 tablets (5-15 mg total) by mouth every 6 (six) hours as needed for breakthrough pain (take between percocet for breaththrough pain). 07/12/15   Montez Morita, PA-C  oxyCODONE-acetaminophen (PERCOCET/ROXICET) 5-325 MG tablet Take 1-2 tablets by mouth every 6 (six) hours as needed. 07/12/15   Montez Morita, PA-C  PARoxetine (PAXIL) 40 MG tablet Take 40 mg by mouth every morning.    [provider]  traZODone (DESYREL) 100 MG tablet Take 100 mg by mouth at bedtime as needed for sleep.    [provider]    Family History History reviewed. No pertinent family history.  Social History Social History   Tobacco  Use  . Smoking status: Current Every Day Smoker    Packs/day: 1.00    Years: 30.00    Pack years: 30.00    Types: Cigarettes  . Smokeless tobacco: Never Used  Substance Use Topics  . Alcohol use: Yes    Comment: mixed drink on weekend  . Drug use: No     Allergies   Penicillins   Review of Systems Review of Systems  Constitutional: Negative for chills and fever.  Musculoskeletal: Negative for arthralgias, back pain and joint swelling.  Skin: Positive for wound.       Laceration left third toe  Neurological: Negative for dizziness, weakness and numbness.  Hematological: Does not bruise/bleed easily.    All other systems reviewed and are negative.    Physical Exam Updated Vital Signs BP (!) 155/91 (BP Location: Right Arm)   Pulse (!) 113   Temp 97.7 F (36.5 C) (Oral)   Resp 17   Ht 6\' 1"  (1.854 m)   Wt 100.2 kg (221 lb)   SpO2 95%   BMI 29.16 kg/m   Physical Exam  Constitutional: He is oriented to person, place, and time. He appears well-developed and well-nourished. No distress.  HENT:  Head: Normocephalic and atraumatic.  Cardiovascular: Normal rate, regular rhythm and intact distal pulses.  No murmur heard. Pulmonary/Chest: Effort normal and breath sounds normal. No respiratory distress.  Musculoskeletal: Normal range of motion. He exhibits no edema or tenderness.  Patient has full range of motion of the toe.  No bony deformity seen.  Neurological: He is alert and oriented to person, place, and time. He exhibits normal muscle tone. Coordination normal.  Skin: Skin is warm. Capillary refill takes less than 2 seconds. Laceration noted.  Laceration to the plantar surface of the left third toe.  Bleeding controlled.  No foreign body seen.  Nursing note and vitals reviewed.    ED Treatments / Results  Labs (all labs ordered are listed, but only abnormal results are displayed) Labs Reviewed - No data to display  EKG  EKG Interpretation None       Radiology Dg Toe 3rd Left  Result Date: 03/28/2017 CLINICAL DATA:  Left third toe laceration after injury today. EXAM: LEFT THIRD TOE COMPARISON:  None. FINDINGS: There is no evidence of fracture or dislocation. There is no evidence of arthropathy or other focal bone abnormality. Soft tissues are unremarkable. IMPRESSION: Normal left third toe. Electronically Signed   By: Lupita RaiderJames  Green Jr, M.D.   On: 03/28/2017 12:38    Procedures Procedures (including critical care time)  LACERATION REPAIR Performed by: Ajaya Crutchfield L. Authorized by: Maxwell CaulRIPLETT,Nhat Hearne L. Consent: Verbal consent obtained. Risks and benefits: risks,  benefits and alternatives were discussed Consent given by: patient Patient identity confirmed: provided demographic data Prepped and Draped in normal sterile fashion Wound explored  Laceration Location: left third toe  Laceration Length: 1 cm  No Foreign Bodies seen or palpated  Anesthesia: local infiltration  Local anesthetic: lidocaine 2 % w/o epinephrine  Anesthetic total: 2  ml  Irrigation method: syringe Amount of cleaning: standard  Skin closure: 4-0 ethilon  Number of sutures: 3  Technique: simple interrupted  Patient tolerance: Patient tolerated the procedure well with no immediate complications.   Medications Ordered in ED Medications  lidocaine (XYLOCAINE) 2 % injection (not administered)  lidocaine (XYLOCAINE) 2 % injection 2 mL (2 mLs Intradermal Given 03/28/17 1311)  oxyCODONE-acetaminophen (PERCOCET/ROXICET) 5-325 MG per tablet 1 tablet (1 tablet Oral Given 03/28/17 1311)  Initial Impression / Assessment and Plan / ED Course  I have reviewed the triage vital signs and the nursing notes.  Pertinent labs & imaging results that were available during my care of the patient were reviewed by me and considered in my medical decision making (see chart for details).     X-ray negative for fracture.  Wound cleaned prior to closure no foreign body seen.  TD is up-to-date.  Patient agrees to wound care instructions, return precautions discussed  Final Clinical Impressions(s) / ED Diagnoses   Final diagnoses:  Laceration of lesser toe of left foot without foreign body present or damage to nail, initial encounter    ED Discharge Orders    None       Pauline Ausriplett, Derrill Bagnell, PA-C 03/28/17 1441    Samuel JesterMcManus, Kathleen, DO 03/30/17 1422

## 2017-03-28 NOTE — ED Notes (Signed)
TT in to assess 

## 2017-03-28 NOTE — ED Notes (Signed)
Pt stepped upon a chain saw with lac to the third toe MP plantar surface

## 2017-10-06 ENCOUNTER — Emergency Department (HOSPITAL_COMMUNITY)
Admission: EM | Admit: 2017-10-06 | Discharge: 2017-10-06 | Disposition: A | Discharge status: Home / Self Care | Payer: Medicaid Other | Attending: Emergency Medicine | Admitting: Emergency Medicine

## 2017-10-06 ENCOUNTER — Emergency Department (HOSPITAL_COMMUNITY): Payer: Medicaid Other

## 2017-10-06 ENCOUNTER — Encounter (HOSPITAL_COMMUNITY): Payer: Self-pay | Admitting: Emergency Medicine

## 2017-10-06 DIAGNOSIS — Y999 Unspecified external cause status: Secondary | ICD-10-CM | POA: Insufficient documentation

## 2017-10-06 DIAGNOSIS — I10 Essential (primary) hypertension: Secondary | ICD-10-CM | POA: Insufficient documentation

## 2017-10-06 DIAGNOSIS — Z96651 Presence of right artificial knee joint: Secondary | ICD-10-CM | POA: Insufficient documentation

## 2017-10-06 DIAGNOSIS — Y929 Unspecified place or not applicable: Secondary | ICD-10-CM | POA: Diagnosis not present

## 2017-10-06 DIAGNOSIS — Y939 Activity, unspecified: Secondary | ICD-10-CM | POA: Insufficient documentation

## 2017-10-06 DIAGNOSIS — Z79899 Other long term (current) drug therapy: Secondary | ICD-10-CM | POA: Insufficient documentation

## 2017-10-06 DIAGNOSIS — S299XXA Unspecified injury of thorax, initial encounter: Secondary | ICD-10-CM | POA: Diagnosis present

## 2017-10-06 DIAGNOSIS — F17210 Nicotine dependence, cigarettes, uncomplicated: Secondary | ICD-10-CM | POA: Diagnosis not present

## 2017-10-06 DIAGNOSIS — S2220XA Unspecified fracture of sternum, initial encounter for closed fracture: Secondary | ICD-10-CM | POA: Insufficient documentation

## 2017-10-06 LAB — BASIC METABOLIC PANEL
ANION GAP: 11 (ref 5–15)
BUN: 7 mg/dL (ref 6–20)
CALCIUM: 9.2 mg/dL (ref 8.9–10.3)
CO2: 25 mmol/L (ref 22–32)
Chloride: 96 mmol/L — ABNORMAL LOW (ref 101–111)
Creatinine, Ser: 1.15 mg/dL (ref 0.61–1.24)
GLUCOSE: 123 mg/dL — AB (ref 65–99)
POTASSIUM: 3.8 mmol/L (ref 3.5–5.1)
Sodium: 132 mmol/L — ABNORMAL LOW (ref 135–145)

## 2017-10-06 LAB — CBC
HEMATOCRIT: 44.5 % (ref 39.0–52.0)
HEMOGLOBIN: 14.8 g/dL (ref 13.0–17.0)
MCH: 31.8 pg (ref 26.0–34.0)
MCHC: 33.3 g/dL (ref 30.0–36.0)
MCV: 95.5 fL (ref 78.0–100.0)
Platelets: 297 10*3/uL (ref 150–400)
RBC: 4.66 MIL/uL (ref 4.22–5.81)
RDW: 14.4 % (ref 11.5–15.5)
WBC: 9.9 10*3/uL (ref 4.0–10.5)

## 2017-10-06 LAB — I-STAT TROPONIN, ED: TROPONIN I, POC: 0.01 ng/mL (ref 0.00–0.08)

## 2017-10-06 MED ORDER — OXYCODONE HCL 5 MG PO TABS: 5.0000 mg | ORAL_TABLET | Freq: Four times a day (QID) | ORAL | 0 refills | Status: DC | PRN

## 2017-10-06 MED ORDER — MORPHINE SULFATE (PF) 4 MG/ML IV SOLN
4.0000 mg | Freq: Once | INTRAVENOUS | Status: AC
  Administered 2017-10-06: 4 mg via INTRAVENOUS
  Filled 2017-10-06: qty 1

## 2017-10-06 MED ORDER — IOHEXOL 300 MG/ML  SOLN
75.0000 mL | Freq: Once | INTRAMUSCULAR | Status: AC | PRN
  Administered 2017-10-06: 100 mL via INTRAVENOUS

## 2017-10-06 NOTE — Discharge Instructions (Signed)
It was my pleasure taking care of you today!   You have a sternal fracture. This is something that typically heals with time. You can follow up with your primary care doctor or your orthopedic doctor if your symptoms are not improving in the next week or two. Return to ER for new or worsening symptoms, any additional concerns.   I have given you a refill of your pain medication. It is just a short course until you can see your doctor who manages your pain. You can also take Ibuprofen as needed for pain.   Use the incentive spirometer given to you in the ER today multiple times each day. This makes sure you take big breaths and helps prevent lung infections like pneumonia.

## 2017-10-06 NOTE — ED Notes (Signed)
IS given; pt educated and verbalized understanding of all instructions.

## 2017-10-06 NOTE — ED Notes (Signed)
Pt discharged from ED; instructions provided and scripts given; Pt encouraged to return to ED if symptoms worsen and to f/u with PCP; Pt verbalized understanding of all instructions 
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
## 2018-03-07 DIAGNOSIS — F411 Generalized anxiety disorder: Secondary | ICD-10-CM | POA: Insufficient documentation

## 2018-03-07 DIAGNOSIS — M545 Low back pain, unspecified: Secondary | ICD-10-CM | POA: Insufficient documentation

## 2018-05-28 NOTE — Congregational Nurse Program (Unsigned)
04/18/18 Want B/P taken. B/P 114/84 Pulse 109. .Voices no problems @ this time.  Cecilie Kicks, RN [phone]  05/16/18 B/P check 129/84 Pulse 106. [place] [facility] [phone].

## 2018-10-12 ENCOUNTER — Other Ambulatory Visit: Payer: Self-pay

## 2018-10-12 ENCOUNTER — Encounter (HOSPITAL_COMMUNITY): Payer: Self-pay | Admitting: Emergency Medicine

## 2018-10-12 ENCOUNTER — Emergency Department (HOSPITAL_COMMUNITY): Payer: Medicare Other

## 2018-10-12 ENCOUNTER — Emergency Department (HOSPITAL_COMMUNITY)
Admission: EM | Admit: 2018-10-12 | Discharge: 2018-10-12 | Disposition: A | Discharge status: Home / Self Care | Payer: Medicare Other | Attending: Emergency Medicine | Admitting: Emergency Medicine

## 2018-10-12 DIAGNOSIS — Y9389 Activity, other specified: Secondary | ICD-10-CM | POA: Insufficient documentation

## 2018-10-12 DIAGNOSIS — F17210 Nicotine dependence, cigarettes, uncomplicated: Secondary | ICD-10-CM | POA: Insufficient documentation

## 2018-10-12 DIAGNOSIS — S46912A Strain of unspecified muscle, fascia and tendon at shoulder and upper arm level, left arm, initial encounter: Secondary | ICD-10-CM | POA: Insufficient documentation

## 2018-10-12 DIAGNOSIS — S80212A Abrasion, left knee, initial encounter: Secondary | ICD-10-CM

## 2018-10-12 DIAGNOSIS — I10 Essential (primary) hypertension: Secondary | ICD-10-CM | POA: Diagnosis not present

## 2018-10-12 DIAGNOSIS — Y92008 Other place in unspecified non-institutional (private) residence as the place of occurrence of the external cause: Secondary | ICD-10-CM | POA: Diagnosis not present

## 2018-10-12 DIAGNOSIS — W01198A Fall on same level from slipping, tripping and stumbling with subsequent striking against other object, initial encounter: Secondary | ICD-10-CM | POA: Insufficient documentation

## 2018-10-12 DIAGNOSIS — Z79899 Other long term (current) drug therapy: Secondary | ICD-10-CM | POA: Diagnosis not present

## 2018-10-12 DIAGNOSIS — S40012A Contusion of left shoulder, initial encounter: Secondary | ICD-10-CM | POA: Insufficient documentation

## 2018-10-12 DIAGNOSIS — Z96651 Presence of right artificial knee joint: Secondary | ICD-10-CM | POA: Insufficient documentation

## 2018-10-12 DIAGNOSIS — Y998 Other external cause status: Secondary | ICD-10-CM | POA: Diagnosis not present

## 2018-10-12 DIAGNOSIS — Z23 Encounter for immunization: Secondary | ICD-10-CM | POA: Diagnosis not present

## 2018-10-12 DIAGNOSIS — S4992XA Unspecified injury of left shoulder and upper arm, initial encounter: Secondary | ICD-10-CM | POA: Diagnosis present

## 2018-10-12 MED ORDER — BACITRACIN ZINC 500 UNIT/GM EX OINT
TOPICAL_OINTMENT | CUTANEOUS | Status: AC
  Administered 2018-10-12: 14:00:00
  Filled 2018-10-12: qty 0.9

## 2018-10-12 MED ORDER — HYDROCODONE-ACETAMINOPHEN 5-325 MG PO TABS: 1.0000 | ORAL_TABLET | ORAL | 0 refills | Status: DC | PRN

## 2018-10-12 MED ORDER — ONDANSETRON HCL 4 MG PO TABS
4.0000 mg | ORAL_TABLET | Freq: Once | ORAL | Status: AC
Start: 2018-10-12 — End: 2018-10-12
  Administered 2018-10-12: 14:00:00 4 mg via ORAL
  Filled 2018-10-12: qty 1

## 2018-10-12 MED ORDER — TRAMADOL HCL 50 MG PO TABS
100.0000 mg | ORAL_TABLET | Freq: Once | ORAL | Status: AC
  Administered 2018-10-12: 100 mg via ORAL
  Filled 2018-10-12: qty 2

## 2018-10-12 MED ORDER — TETANUS-DIPHTH-ACELL PERTUSSIS 5-2.5-18.5 LF-MCG/0.5 IM SUSP
0.5000 mL | Freq: Once | INTRAMUSCULAR | Status: AC
  Administered 2018-10-12: 14:00:00 0.5 mL via INTRAMUSCULAR
  Filled 2018-10-12: qty 0.5

## 2018-10-12 NOTE — Discharge Instructions (Addendum)
Your x-ray is negative for fracture or dislocation.  Your examination questions a strain of your shoulder versus possible rotator cuff injury.  Please call Dr. Romeo Apple for orthopedic evaluation and management of this issue.  Please use your sling until seen by orthopedics.  Please cleanse your wound daily with soap and water and apply dressing until the wound heals from the inside out.  Your tetanus status was updated today.  Please make a note of this in your records.  Use Tylenol extra strength every 4 hours for mild pain.  Continue your oxycodone for more severe pain.

## 2018-10-12 NOTE — ED Notes (Signed)
Swelling to left shoulder. Pain is tolerable

## 2018-10-12 NOTE — ED Triage Notes (Signed)
Pt states his dog tripped him on his patio yesterday and landed on left knee and shoulder.  Swelling to left shoulder.

## 2018-10-12 NOTE — ED Notes (Signed)
Pt back from x-ray.

## 2018-10-12 NOTE — ED Provider Notes (Signed)
[facility] EMERGENCY DEPARTMENT Provider Note   CSN: [id number] Arrival date & time: 10/12/18  1012    History   Chief Complaint Chief Complaint  Patient presents with  . Fall    HPI Joseph Jarvis is a 63 y.o. male.     Dog jumped on pt, he Larey Seat and injured the left knee and the left shoulder. This occurred on yesterday May 29.  The history is provided by the patient.  Fall  This is a new problem. The problem occurs constantly. The problem has been gradually worsening. Pertinent negatives include no chest pain, no abdominal pain and no shortness of breath. Exacerbated by: movement of left knee and shoulder. Nothing relieves the symptoms. He has tried acetaminophen for the symptoms.    Past Medical History:  Diagnosis Date  . Arthritis   . Back pain   . Compartment syndrome of right lower extremity (HCC) 07/12/2015   lower leg   . GERD (gastroesophageal reflux disease)   . Hypertension   . Panic attack   . Periprosthetic fracture of proximal end of tibia, right 07/12/2015   right    Patient Active Problem List   Diagnosis Date Noted  . Periprosthetic fracture of proximal end of tibia, right 07/12/2015  . Compartment syndrome of right lower extremity (HCC) 07/12/2015  . GERD (gastroesophageal reflux disease)   . Hypertension   . Closed fracture of proximal end of right tibia and fibula; compartment syndrome right leg 07/03/2015  . Closed fracture of right tibia and fibula 07/03/2015  . Osteoarthritis of right knee 10/20/2013    Past Surgical History:  Procedure Laterality Date  . APPLICATION OF WOUND VAC Right 07/06/2015   Procedure:  WOUND VAC PLACEMENT;  Surgeon: Myrene Galas, MD;  Location: [facility] OR;  Service: Orthopedics;  Laterality: Right;  . DORSAL COMPARTMENT RELEASE Right 07/03/2015   Procedure: COMPARTMENT SYNDROME RELEASE RIGHT LOWER LEG, APPLICATION OF LARGE EXTERNAL FIXATOR;  Surgeon: Kathryne Hitch, MD;  Location: [facility] OR;  Service: Orthopedics;   Laterality: Right;  . ORIF TIBIA FRACTURE Right 07/03/2015  . ORIF TIBIA PLATEAU Right 07/06/2015   Procedure: OPEN REDUCTION INTERNAL FIXATION (ORIF) TIBIAL PLATEAU;  Surgeon: Myrene Galas, MD;  Location: [facility] OR;  Service: Orthopedics;  Laterality: Right;  . SECONDARY CLOSURE OF WOUND Right 07/06/2015   Procedure: SECONDARY CLOSURE OF WOUND;  Surgeon: Myrene Galas, MD;  Location: [facility] OR;  Service: Orthopedics;  Laterality: Right;  . SKIN SPLIT GRAFT Right 07/09/2015   Procedure: RIGHT LEG SPLIT THICKNESS SKIN GRAFT;  Surgeon: Myrene Galas, MD;  Location: [facility] OR;  Service: Orthopedics;  Laterality: Right;  . TOTAL KNEE ARTHROPLASTY Right 10/20/2013   Procedure: TOTAL KNEE ARTHROPLASTY;  Surgeon: Harvie Junior, MD;  Location: [facility] OR;  Service: Orthopedics;  Laterality: Right;        Home Medications    Prior to Admission medications   Medication Sig Start Date End Date Taking? Authorizing Provider  albuterol (PROVENTIL HFA) 108 (90 Base) MCG/ACT inhaler Inhale 2 puffs into the lungs every 6 (six) hours as needed for wheezing. 08/16/17   [provider]  ALPRAZolam Prudy Feeler) 1 MG tablet Take 1 mg by mouth 3 (three) times daily as needed for anxiety.    [provider]  aspirin EC 325 MG tablet Take 1 tablet (325 mg total) by mouth 2 (two) times daily after a meal. Patient not taking: Reported on 10/06/2017 07/12/15   Montez Morita, PA-C  losartan-hydrochlorothiazide (HYZAAR) 50-12.5 MG tablet Take 1  tablet by mouth daily. 04/12/15   [provider]  methocarbamol (ROBAXIN) 500 MG tablet Take 1-2 tablets (500-1,000 mg total) by mouth every 6 (six) hours as needed for muscle spasms. Patient not taking: Reported on 10/06/2017 07/12/15   Montez MoritaPaul, Keith, PA-C  omeprazole (PRILOSEC OTC) 20 MG tablet Take 20 mg by mouth daily as needed (for heartburn).    [provider]  oxyCODONE (OXY IR/ROXICODONE) 5 MG immediate release tablet Take 1 tablet (5 mg total) by mouth every 6 (six)  hours as needed for severe pain. 10/06/17   Ward, Chase PicketJaime Pilcher, PA-C  oxyCODONE-acetaminophen (PERCOCET/ROXICET) 5-325 MG tablet Take 1-2 tablets by mouth every 6 (six) hours as needed. 07/12/15   Montez MoritaPaul, Keith, PA-C  PARoxetine (PAXIL) 40 MG tablet Take 40 mg by mouth every morning.    [provider]  traZODone (DESYREL) 100 MG tablet Take 100 mg by mouth at bedtime as needed for sleep.    [provider]    Family History History reviewed. No pertinent family history.  Social History Social History   Tobacco Use  . Smoking status: Current Every Day Smoker    Packs/day: 1.00    Years: 30.00    Pack years: 30.00    Types: Cigarettes  . Smokeless tobacco: Never Used  Substance Use Topics  . Alcohol use: Yes    Comment: mixed drink on weekend  . Drug use: No     Allergies   Penicillins   Review of Systems Review of Systems  Constitutional: Negative for activity change.       All ROS Neg except as noted in HPI  HENT: Negative for nosebleeds.   Eyes: Negative for photophobia and discharge.  Respiratory: Negative for cough, shortness of breath and wheezing.   Cardiovascular: Negative for chest pain and palpitations.  Gastrointestinal: Negative for abdominal pain and blood in stool.  Genitourinary: Negative for dysuria, frequency and hematuria.  Musculoskeletal: Positive for arthralgias. Negative for back pain and neck pain.  Skin: Negative.   Neurological: Negative for dizziness, seizures and speech difficulty.  Psychiatric/Behavioral: Negative for confusion and hallucinations.     Physical Exam Updated Vital Signs BP 132/84 (BP Location: Right Arm)   Pulse (!) 107   Temp 97.6 F (36.4 C) (Oral)   Ht 6' (1.829 m)   Wt 100.2 kg   SpO2 97%   BMI 29.96 kg/m   Physical Exam Vitals signs and nursing note reviewed.  Constitutional:      Appearance: He is well-developed. He is not toxic-appearing.  HENT:     Head: Normocephalic.     Right Ear:  Tympanic membrane and external ear normal.     Left Ear: Tympanic membrane and external ear normal.  Eyes:     General: Lids are normal.     Pupils: Pupils are equal, round, and reactive to light.  Neck:     Musculoskeletal: Normal range of motion and neck supple.     Vascular: No carotid bruit.  Cardiovascular:     Rate and Rhythm: Normal rate and regular rhythm.     Pulses: Normal pulses.     Heart sounds: Normal heart sounds.  Pulmonary:     Effort: No respiratory distress.     Breath sounds: Normal breath sounds.  Abdominal:     General: Bowel sounds are normal.     Palpations: Abdomen is soft.     Tenderness: There is no abdominal tenderness. There is no guarding.  Musculoskeletal:  Left shoulder: He exhibits decreased range of motion and tenderness. He exhibits no swelling and no deformity.     Comments: abrassion of the left knee.  There is no deformity of the left shoulder.  There is noted pain and difficulty with shoulder shrug.  There is pain with attempted ad duction and AB duction of the shoulder.  Radial pulses 2+.  Lymphadenopathy:     Head:     Right side of head: No submandibular adenopathy.     Left side of head: No submandibular adenopathy.     Cervical: No cervical adenopathy.  Skin:    General: Skin is warm and dry.  Neurological:     Mental Status: He is alert and oriented to person, place, and time.     Cranial Nerves: No cranial nerve deficit.     Sensory: No sensory deficit.  Psychiatric:        Speech: Speech normal.      ED Treatments / Results  Labs (all labs ordered are listed, but only abnormal results are displayed) Labs Reviewed - No data to display  EKG None  Radiology Dg Shoulder Left  Result Date: 10/12/2018 CLINICAL DATA:  Fall, left shoulder pain EXAM: LEFT SHOULDER - 2+ VIEW COMPARISON:  09/13/2012 FINDINGS: No fracture or dislocation is seen. The joint spaces are preserved. Visualized soft tissues are within normal limits.  Visualized left lung is clear. IMPRESSION: Negative. Electronically Signed   By: Charline Bills M.D.   On: 10/12/2018 11:15    Procedures Procedures (including critical care time)  Medications Ordered in ED Medications - No data to display   Initial Impression / Assessment and Plan / ED Course  I have reviewed the triage vital signs and the nursing notes.  Pertinent labs & imaging results that were available during my care of the patient were reviewed by me and considered in my medical decision making (see chart for details).          Final Clinical Impressions(s) / ED Diagnoses MDM  The patient sustained a fall on the left shoulder.  On examination he has pain with attempted shoulder shrug as well as abduction and adduction.  X-rays negative for fracture or dislocation.  Patient placed in a shoulder immobilizer and given medication for pain.  Review of the [place] reveals the patient received a 30-day supply of oxycodone on May 7.  I have asked the patient to use the shoulder immobilizer until seen by orthopedics.  Patient referred to Dr. Romeo Apple for additional evaluation.   Final diagnoses:  Contusion of left shoulder, initial encounter  Shoulder strain, left, initial encounter  Abrasion, left knee, initial encounter    ED Discharge Orders    None       Ivery Quale, PA-C 10/12/18 1416    Samuel Jester, DO 10/16/18 [phone]

## 2018-10-12 NOTE — ED Notes (Signed)
Pt unable to sign due to arm injury. Verbalized understanding.

## 2019-08-19 ENCOUNTER — Emergency Department (HOSPITAL_COMMUNITY)
Admission: EM | Admit: 2019-08-19 | Discharge: 2019-08-19 | Disposition: A | Discharge status: Home / Self Care | Payer: Medicare Other | Attending: Emergency Medicine | Admitting: Emergency Medicine

## 2019-08-19 ENCOUNTER — Emergency Department (HOSPITAL_COMMUNITY): Payer: Medicare Other

## 2019-08-19 ENCOUNTER — Other Ambulatory Visit: Payer: Self-pay

## 2019-08-19 ENCOUNTER — Encounter (HOSPITAL_COMMUNITY): Payer: Self-pay

## 2019-08-19 DIAGNOSIS — W01198A Fall on same level from slipping, tripping and stumbling with subsequent striking against other object, initial encounter: Secondary | ICD-10-CM | POA: Diagnosis not present

## 2019-08-19 DIAGNOSIS — I10 Essential (primary) hypertension: Secondary | ICD-10-CM | POA: Diagnosis not present

## 2019-08-19 DIAGNOSIS — M545 Low back pain: Secondary | ICD-10-CM | POA: Diagnosis not present

## 2019-08-19 DIAGNOSIS — Y9389 Activity, other specified: Secondary | ICD-10-CM | POA: Diagnosis not present

## 2019-08-19 DIAGNOSIS — R109 Unspecified abdominal pain: Secondary | ICD-10-CM | POA: Diagnosis not present

## 2019-08-19 DIAGNOSIS — Y998 Other external cause status: Secondary | ICD-10-CM | POA: Diagnosis not present

## 2019-08-19 DIAGNOSIS — Z96651 Presence of right artificial knee joint: Secondary | ICD-10-CM | POA: Diagnosis not present

## 2019-08-19 DIAGNOSIS — Y9289 Other specified places as the place of occurrence of the external cause: Secondary | ICD-10-CM | POA: Diagnosis not present

## 2019-08-19 DIAGNOSIS — F17210 Nicotine dependence, cigarettes, uncomplicated: Secondary | ICD-10-CM | POA: Diagnosis not present

## 2019-08-19 DIAGNOSIS — S32009A Unspecified fracture of unspecified lumbar vertebra, initial encounter for closed fracture: Secondary | ICD-10-CM | POA: Insufficient documentation

## 2019-08-19 DIAGNOSIS — S2242XA Multiple fractures of ribs, left side, initial encounter for closed fracture: Secondary | ICD-10-CM | POA: Diagnosis not present

## 2019-08-19 DIAGNOSIS — S299XXA Unspecified injury of thorax, initial encounter: Secondary | ICD-10-CM | POA: Diagnosis present

## 2019-08-19 LAB — COMPREHENSIVE METABOLIC PANEL
ALT: 37 U/L (ref 0–44)
AST: 38 U/L (ref 15–41)
Albumin: 4.1 g/dL (ref 3.5–5.0)
Alkaline Phosphatase: 49 U/L (ref 38–126)
Anion gap: 10 (ref 5–15)
BUN: 12 mg/dL (ref 8–23)
CO2: 26 mmol/L (ref 22–32)
Calcium: 9.7 mg/dL (ref 8.9–10.3)
Chloride: 99 mmol/L (ref 98–111)
Creatinine, Ser: 0.96 mg/dL (ref 0.61–1.24)
GFR calc Af Amer: >60 mL/min (ref >60)
GFR calc non Af Amer: >60 mL/min (ref >60)
Glucose, Bld: 118 mg/dL — ABNORMAL HIGH (ref 70–99)
Potassium: 3.5 mmol/L (ref 3.5–5.1)
Sodium: 135 mmol/L (ref 135–145)
Total Bilirubin: 1.1 mg/dL (ref 0.3–1.2)
Total Protein: 8.1 g/dL (ref 6.5–8.1)

## 2019-08-19 LAB — URINALYSIS, ROUTINE W REFLEX MICROSCOPIC
Glucose, UA: NEGATIVE mg/dL
Hgb urine dipstick: NEGATIVE
Nitrite: NEGATIVE
Protein, ur: 100 mg/dL — AB
Specific Gravity, Urine: 1.01 (ref 1.005–1.030)
pH: 7 (ref 5.0–8.0)

## 2019-08-19 LAB — CBC
HCT: 50.5 % (ref 39.0–52.0)
Hemoglobin: 16.9 g/dL (ref 13.0–17.0)
MCH: 31.9 pg (ref 26.0–34.0)
MCHC: 33.5 g/dL (ref 30.0–36.0)
MCV: 95.5 fL (ref 80.0–100.0)
Platelets: 351 10*3/uL (ref 150–400)
RBC: 5.29 MIL/uL (ref 4.22–5.81)
RDW: 15.5 % (ref 11.5–15.5)
WBC: 10.4 10*3/uL (ref 4.0–10.5)
nRBC: 0 % (ref 0.0–0.2)

## 2019-08-19 LAB — URINALYSIS, MICROSCOPIC (REFLEX): RBC / HPF: NONE SEEN RBC/hpf (ref 0–5)

## 2019-08-19 MED ORDER — OXYCODONE HCL 5 MG PO TABS
10.0000 mg | ORAL_TABLET | Freq: Once | ORAL | Status: AC
  Administered 2019-08-19: 10 mg via ORAL
  Filled 2019-08-19: qty 2

## 2019-08-19 MED ORDER — HYDROCODONE-ACETAMINOPHEN 5-325 MG PO TABS: 1.0000 | ORAL_TABLET | Freq: Four times a day (QID) | ORAL | 0 refills | Status: DC | PRN

## 2019-08-19 MED ORDER — IOHEXOL 300 MG/ML  SOLN
100.0000 mL | Freq: Once | INTRAMUSCULAR | Status: AC | PRN
  Administered 2019-08-19: 100 mL via INTRAVENOUS

## 2019-08-19 NOTE — ED Triage Notes (Signed)
Pt presents to ED with complaints of lower back pain following fall on Friday.

## 2019-08-19 NOTE — ED Provider Notes (Signed)
[facility]-EMERGENCY DEPT [facility] Emergency Department Provider Note MRN:  [id number]  Arrival date & time: 08/19/19     Chief Complaint   Back Pain   History of Present Illness   Joseph Jarvis is a 64 y.o. year-old male with a history of hypertension presenting to the ED with chief complaint of back pain.  Patient was loading a tractor when he tripped and fell backwards, landed on a metal object that struck him on the left flank.  No head trauma, no loss of consciousness, endorsing severe left flank pain with any motion, bruising to the area.  Denies hematuria, no chest pain or shortness of breath, no abdominal pain.  Pain is moderate to severe.  Review of Systems  A complete 10 system review of systems was obtained and all systems are negative except as noted in the HPI and PMH.   Patient's Health History    Past Medical History:  Diagnosis Date  . Arthritis   . Back pain   . Compartment syndrome of right lower extremity (HCC) 07/12/2015   lower leg   . GERD (gastroesophageal reflux disease)   . Hypertension   . Panic attack   . Periprosthetic fracture of proximal end of tibia, right 07/12/2015   right    Past Surgical History:  Procedure Laterality Date  . APPLICATION OF WOUND VAC Right 07/06/2015   Procedure:  WOUND VAC PLACEMENT;  Surgeon: Myrene Galas, MD;  Location: [facility] OR;  Service: Orthopedics;  Laterality: Right;  . DORSAL COMPARTMENT RELEASE Right 07/03/2015   Procedure: COMPARTMENT SYNDROME RELEASE RIGHT LOWER LEG, APPLICATION OF LARGE EXTERNAL FIXATOR;  Surgeon: Kathryne Hitch, MD;  Location: [facility] OR;  Service: Orthopedics;  Laterality: Right;  . ORIF TIBIA FRACTURE Right 07/03/2015  . ORIF TIBIA PLATEAU Right 07/06/2015   Procedure: OPEN REDUCTION INTERNAL FIXATION (ORIF) TIBIAL PLATEAU;  Surgeon: Myrene Galas, MD;  Location: [facility] OR;  Service: Orthopedics;  Laterality: Right;  . SECONDARY CLOSURE OF WOUND Right 07/06/2015   Procedure: SECONDARY CLOSURE OF  WOUND;  Surgeon: Myrene Galas, MD;  Location: [facility] OR;  Service: Orthopedics;  Laterality: Right;  . SKIN SPLIT GRAFT Right 07/09/2015   Procedure: RIGHT LEG SPLIT THICKNESS SKIN GRAFT;  Surgeon: Myrene Galas, MD;  Location: [facility] OR;  Service: Orthopedics;  Laterality: Right;  . TOTAL KNEE ARTHROPLASTY Right 10/20/2013   Procedure: TOTAL KNEE ARTHROPLASTY;  Surgeon: Harvie Junior, MD;  Location: [facility] OR;  Service: Orthopedics;  Laterality: Right;    No family history on file.  Social History   Socioeconomic History  . Marital status: Divorced    Spouse name: Not on file  . Number of children: Not on file  . Years of education: Not on file  . Highest education level: Not on file  Occupational History  . Not on file  Tobacco Use  . Smoking status: Current Every Day Smoker    Packs/day: 1.00    Years: 30.00    Pack years: 30.00    Types: Cigarettes  . Smokeless tobacco: Never Used  Substance and Sexual Activity  . Alcohol use: Yes    Comment: mixed drink on weekend  . Drug use: No  . Sexual activity: Not Currently  Other Topics Concern  . Not on file  Social History Narrative  . Not on file   Social Determinants of Health   Financial Resource Strain:   . Difficulty of Paying Living Expenses:   Food Insecurity:   . Worried About [employer] of  Food in the Last Year:   . [place] in the Last Year:   Transportation Needs:   . Film/video editor (Medical):   Marland Kitchen Lack of Transportation (Non-Medical):   Physical Activity:   . Days of Exercise per Week:   . Minutes of Exercise per Session:   Stress:   . Feeling of Stress :   Social Connections:   . Frequency of Communication with Friends and Family:   . Frequency of Social Gatherings with Friends and Family:   . Attends Religious Services:   . Active Member of Clubs or Organizations:   . Attends Archivist Meetings:   Marland Kitchen Marital Status:   Intimate Partner Violence:   . Fear of Current or Ex-Partner:   .  Emotionally Abused:   Marland Kitchen Physically Abused:   . Sexually Abused:      Physical Exam   Vitals:   08/19/19 1345  BP: 128/88  Pulse: (!) 113  Resp: 18  Temp: 97.7 F (36.5 C)  SpO2: 94%    CONSTITUTIONAL: Chronically ill-appearing, NAD NEURO:  Alert and oriented x 3, no focal deficits EYES:  eyes equal and reactive ENT/NECK:  no LAD, no JVD CARDIO: Tachycardic rate, well-perfused, normal S1 and S2 PULM:  CTAB no wheezing or rhonchi GI/GU:  normal bowel sounds, non-distended, non-tender MSK/SPINE:  No gross deformities, no edema SKIN: Bruising to the left flank PSYCH:  Appropriate speech and behavior  *Additional and/or pertinent findings included in MDM below  Diagnostic and Interventional Summary    EKG Interpretation  Date/Time:    Ventricular Rate:    PR Interval:    QRS Duration:   QT Interval:    QTC Calculation:   R Axis:     Text Interpretation:        Labs Reviewed  COMPREHENSIVE METABOLIC PANEL - Abnormal; Notable for the following components:      Result Value   Glucose, Bld 118 (*)    All other components within normal limits  CBC  URINALYSIS, ROUTINE W REFLEX MICROSCOPIC    DG Chest 2 View  Final Result    CT ABDOMEN PELVIS W CONTRAST  Final Result      Medications  oxyCODONE (Oxy IR/ROXICODONE) immediate release tablet 10 mg (10 mg Oral Given 08/19/19 1409)  iohexol (OMNIPAQUE) 300 MG/ML solution 100 mL (100 mLs Intravenous Contrast Given 08/19/19 1500)     Procedures  /  Critical Care Procedures  ED Course and Medical Decision Making  I have reviewed the triage vital signs, the nursing notes, and pertinent available records from the EMR.  Listed above are laboratory and imaging tests that I personally ordered, reviewed, and interpreted and then considered in my medical decision making (see below for details).      CT to exclude blunt renal injury, work-up pending.  CT reveals fractures of ribs 11 and 12 on the left as well as 3  contiguous transverse process fractures.  No evidence of blunt trauma to the kidney or organs.  Given the presence of multiple TP fractures, will consult neurosurgery for recommendations.  Patient is neurologically intact.  Signed out to oncoming provider at shift change.  Barth Kirks. Sedonia Small, [place] [email or web address]  Final Clinical Impressions(s) / ED Diagnoses     ICD-10-CM   1. Closed fracture of multiple ribs of left side, initial encounter  S22.42XA   2. Flank pain  R10.9 DG Chest 2 View  DG Chest 2 View  3. Closed fracture of transverse process of lumbar vertebra, initial encounter [facility])  S32.009A     ED Discharge Orders    None       Discharge Instructions Discussed with and Provided to Patient:     Discharge Instructions     You were evaluated in the Emergency Department and after careful evaluation, we did not find any emergent condition requiring admission or further testing in the hospital.  Your symptoms seem to be due to broken ribs as well as broken bones in your spine.  Please return to the Emergency Department if you experience any worsening of your condition.  We encourage you to follow up with a primary care provider.  Thank you for allowing [place] to be a part of your care.       Sabas Sous, MD 08/19/19 [phone]

## 2019-08-19 NOTE — ED Provider Notes (Signed)
Patient with lumbar transverse process fractures along with some rib fractures.  I spoke with neurosurgery and they stated just rest and pain medicine to follow-up as needed with appropriate treatment   Bethann Berkshire, MD 08/19/19 1657

## 2019-08-19 NOTE — ED Notes (Signed)
Pt is aware we need urine sample, urinal at bedside.  

## 2019-08-19 NOTE — Discharge Instructions (Addendum)
You were evaluated in the Emergency Department and after careful evaluation, we did not find any emergent condition requiring admission or further testing in the hospital.  Your symptoms seem to be due to broken ribs as well as broken bones in your spine.  Please return to the Emergency Department if you experience any worsening of your condition.  We encourage you to follow up with a primary care provider.  Thank you for allowing [place] to be a part of your care.    Follow up with your md next week for recheck

## 2019-08-19 NOTE — ED Notes (Signed)
Pt unable to urinate at this time.  

## 2020-03-22 ENCOUNTER — Telehealth: Payer: Self-pay

## 2020-03-22 NOTE — Telephone Encounter (Signed)
[facility] Patient Product/process development scientist completed.    The patient is insured through [name]. [name] will require a PA.  I will need to see patient to get some information from him.   We will continue to follow to see if copay assistance is needed.  Clearance Coots, CPhT Specialty Pharmacy Patient [facility] Phone: [phone] Fax:  [phone]

## 2020-03-25 ENCOUNTER — Encounter: Payer: Medicare Other | Admitting: Internal Medicine

## 2020-04-19 ENCOUNTER — Encounter: Payer: Medicare Other | Admitting: Internal Medicine

## 2020-04-23 ENCOUNTER — Encounter: Payer: Medicare Other | Admitting: Internal Medicine

## 2020-04-26 ENCOUNTER — Encounter: Payer: Medicare Other | Admitting: Internal Medicine

## 2020-08-24 ENCOUNTER — Ambulatory Visit: Payer: Medicare Other | Admitting: Infectious Diseases

## 2020-09-15 ENCOUNTER — Other Ambulatory Visit (HOSPITAL_COMMUNITY): Payer: Self-pay

## 2020-09-15 ENCOUNTER — Ambulatory Visit: Payer: Medicare Other | Admitting: Internal Medicine

## 2020-09-15 ENCOUNTER — Telehealth: Payer: Self-pay

## 2020-09-15 NOTE — Telephone Encounter (Signed)
Attempted to call patient to reschedule today's missed appointment, no answer. Patient does not have secure voicemail set up.   Sandie Ano, RN

## 2020-09-15 NOTE — Progress Notes (Deleted)
[facility]  Reason for Consult: Hepatitis C  Referring Provider: Kathlen Brunswick   HPI:    Joseph Jarvis is a 65 y.o. male with PMHx as below who presents to the clinic for hepatitis C.   [name] presents today as a new consult for chronic hepatitis C. He is treatment nave.  Per care everywhere, patient tested positive with HCV antibody and qualitative RNA in May 2017.  He was subsequently referred to infectious disease, however, was unable to follow-up with them for treatment.  Patient's Medications  New Prescriptions   No medications on file  Previous Medications   ALBUTEROL (PROVENTIL HFA) 108 (90 BASE) MCG/ACT INHALER    Inhale 2 puffs into the lungs every 6 (six) hours as needed for wheezing.   ALPRAZOLAM (XANAX) 1 MG TABLET    Take 1 mg by mouth 3 (three) times daily as needed for anxiety.   FLUOXETINE (PROZAC) 40 MG CAPSULE    Take 40 mg by mouth daily.   FLUTICASONE (FLONASE) 50 MCG/ACT NASAL SPRAY    Place 1 spray into both nostrils daily as needed for allergies.    HYDROCHLOROTHIAZIDE (MICROZIDE) 12.5 MG CAPSULE    Take 12.5 mg by mouth daily.   HYDROCODONE-ACETAMINOPHEN (NORCO/VICODIN) 5-325 MG TABLET    Take 1 tablet by mouth every 6 (six) hours as needed.   LOSARTAN (COZAAR) 50 MG TABLET    Take 50 mg by mouth daily.   OMEPRAZOLE (PRILOSEC) 40 MG CAPSULE    Take 40 mg by mouth daily as needed.   OXYCODONE-ACETAMINOPHEN (PERCOCET) 7.5-325 MG TABLET    Take 1 tablet by mouth 2 (two) times daily as needed.   TRAZODONE (DESYREL) 100 MG TABLET    Take 200 mg by mouth at bedtime as needed for sleep.   Modified Medications   No medications on file  Discontinued Medications   No medications on file      Past Medical History:  Diagnosis Date  . Arthritis   . Back pain   . Compartment syndrome of right lower extremity (HCC) 07/12/2015   lower leg   . GERD (gastroesophageal reflux disease)   . Hypertension   . Panic attack   .  Periprosthetic fracture of proximal end of tibia, right 07/12/2015   right    Social History   Tobacco Use  . Smoking status: Current Every Day Smoker    Packs/day: 1.00    Years: 30.00    Pack years: 30.00    Types: Cigarettes  . Smokeless tobacco: Never Used  Vaping Use  . Vaping Use: Never used  Substance Use Topics  . Alcohol use: Yes    Comment: mixed drink on weekend  . Drug use: No    No family history on file.  Allergies  Allergen Reactions  . Penicillins Swelling    .Did it involve swelling of the face/tongue/throat, SOB, or low BP? No Did it involve sudden or severe rash/hives, skin peeling, or any reaction on the inside of your mouth or nose?Yes Did you need to seek medical attention at a hospital or doctor's office? Unknown When did it last happen?Childhood If all above answers are "NO", may proceed with cephalosporin use.     ROS    OBJECTIVE:    There were no vitals filed for this visit.   There is no height or weight on file to calculate BMI.  Physical Exam   Labs and Microbiology:  CBC Latest  Ref Rng & Units 08/19/2019 10/06/2017 07/09/2015  WBC 4.0 - 10.5 K/uL 10.4 9.9 8.6  Hemoglobin 13.0 - 17.0 g/dL 30.1 60.1 0.9(N)  Hematocrit 39.0 - 52.0 % 50.5 44.5 22.2(L)  Platelets 150 - 400 K/uL 351 297 260   CMP Latest Ref Rng & Units 08/19/2019 10/06/2017 07/11/2015  Glucose 70 - 99 mg/dL 235(T) 732(K) 025(K)  BUN 8 - 23 mg/dL 12 7 10   Creatinine 0.61 - 1.24 mg/dL 2.70 6.23  Sodium 135 - 145 mmol/L 135 132(L) 139  Potassium 3.5 - 5.1 mmol/L 3.5 3.8 3.6  Chloride 98 - 111 mmol/L 99 96(L) 103  CO2 22 - 32 mmol/L 26 25 29   Calcium 8.9 - 10.3 mg/dL 9.7 9.2 7.62)  Total Protein 6.5 - 8.1 g/dL 8.1 - -  Total Bilirubin 0.3 - 1.2 mg/dL 1.1 - -  Alkaline Phos 38 - 126 U/L 49 - -  AST 15 - 41 U/L 38 - -  ALT 0 - 44 U/L 37 - -     No results found for this or any previous visit (from the past 240 hour(s)).  Imaging: ***   ASSESSMENT &  PLAN:    No problem-specific Assessment & Plan notes found for this encounter.   No orders of the defined types were placed in this encounter.     ***  for Infectious Disease [facility] 09/15/2020, 4:51 AM

## 2021-07-11 DIAGNOSIS — K7460 Unspecified cirrhosis of liver: Secondary | ICD-10-CM | POA: Insufficient documentation

## 2022-02-20 IMAGING — DX DG CHEST 2V
2 series · 2 of 2 positions shown · non-contrast
Comparison: Chest radiograph dated 10/06/2017.

CLINICAL DATA: 64-year-old male with flank pain and low back pain.
Recent fall.

EXAM:
CHEST - 2 VIEW

[chest pa]
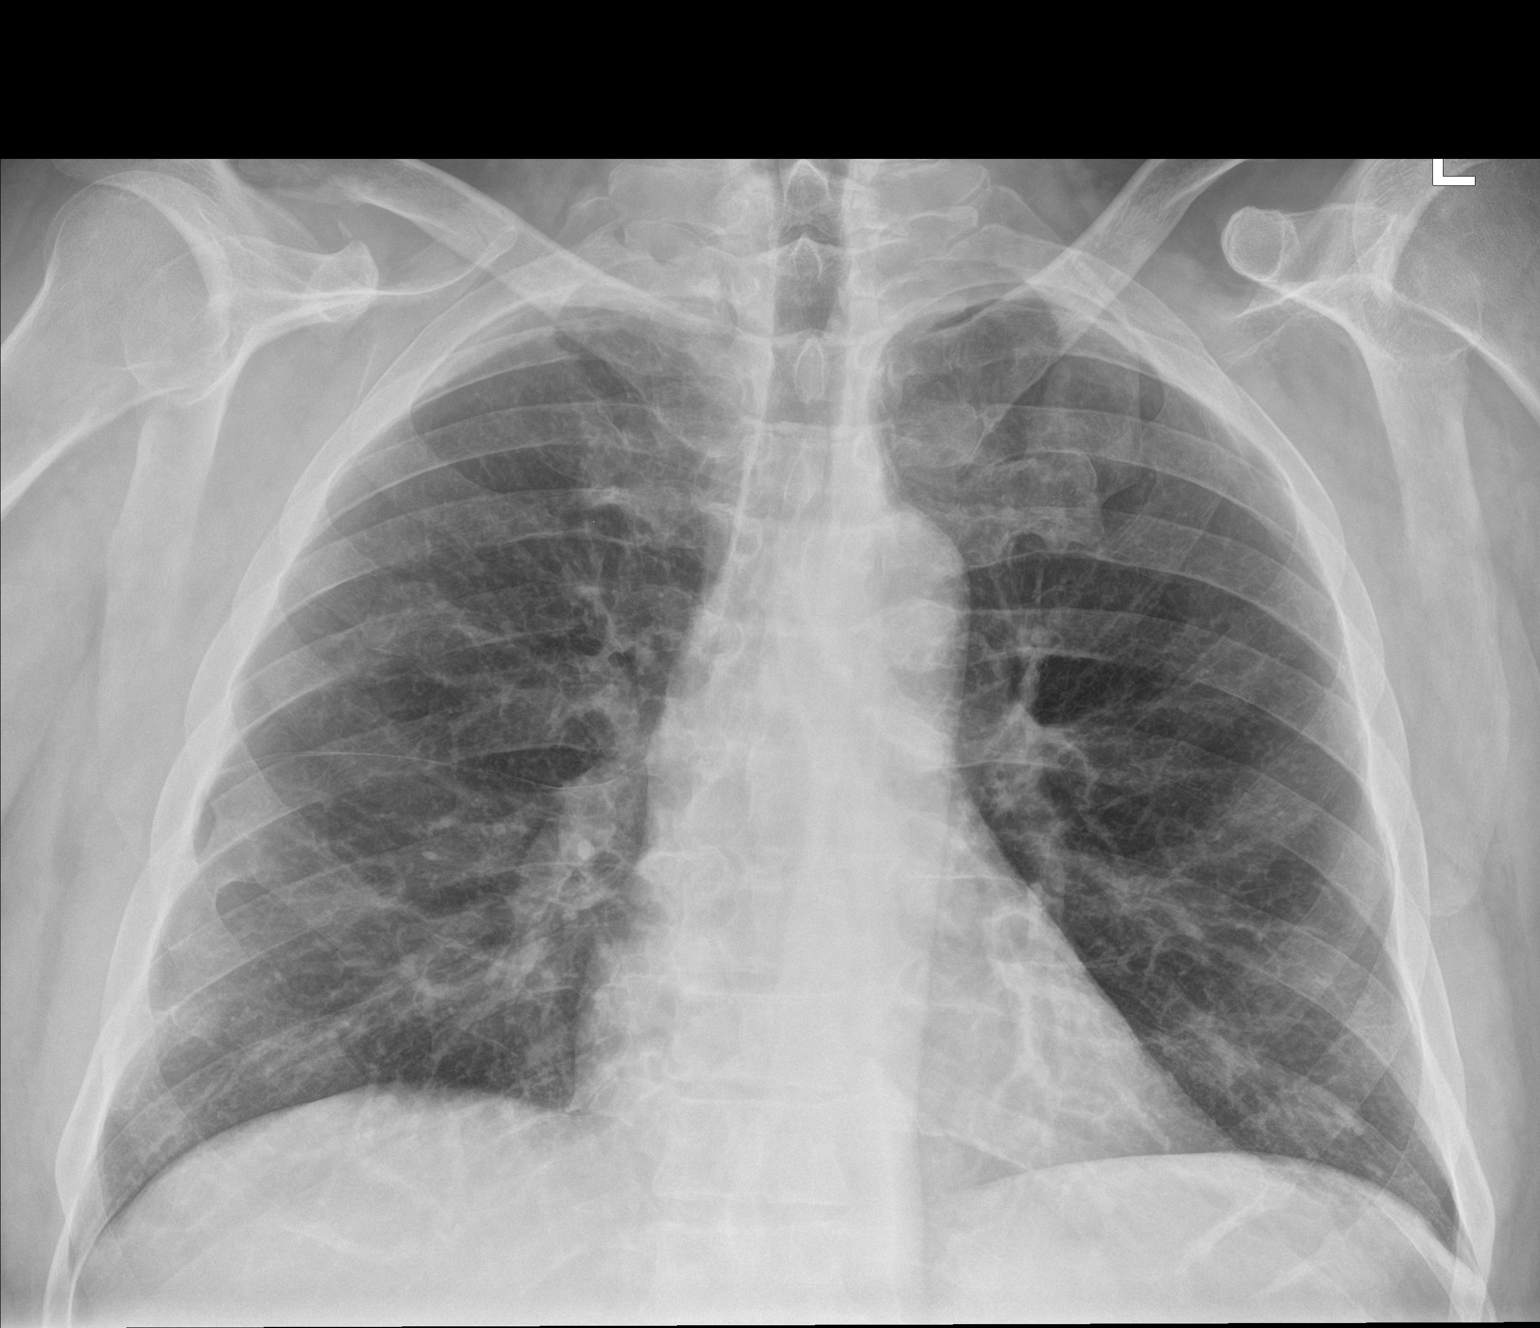

[chest lat]
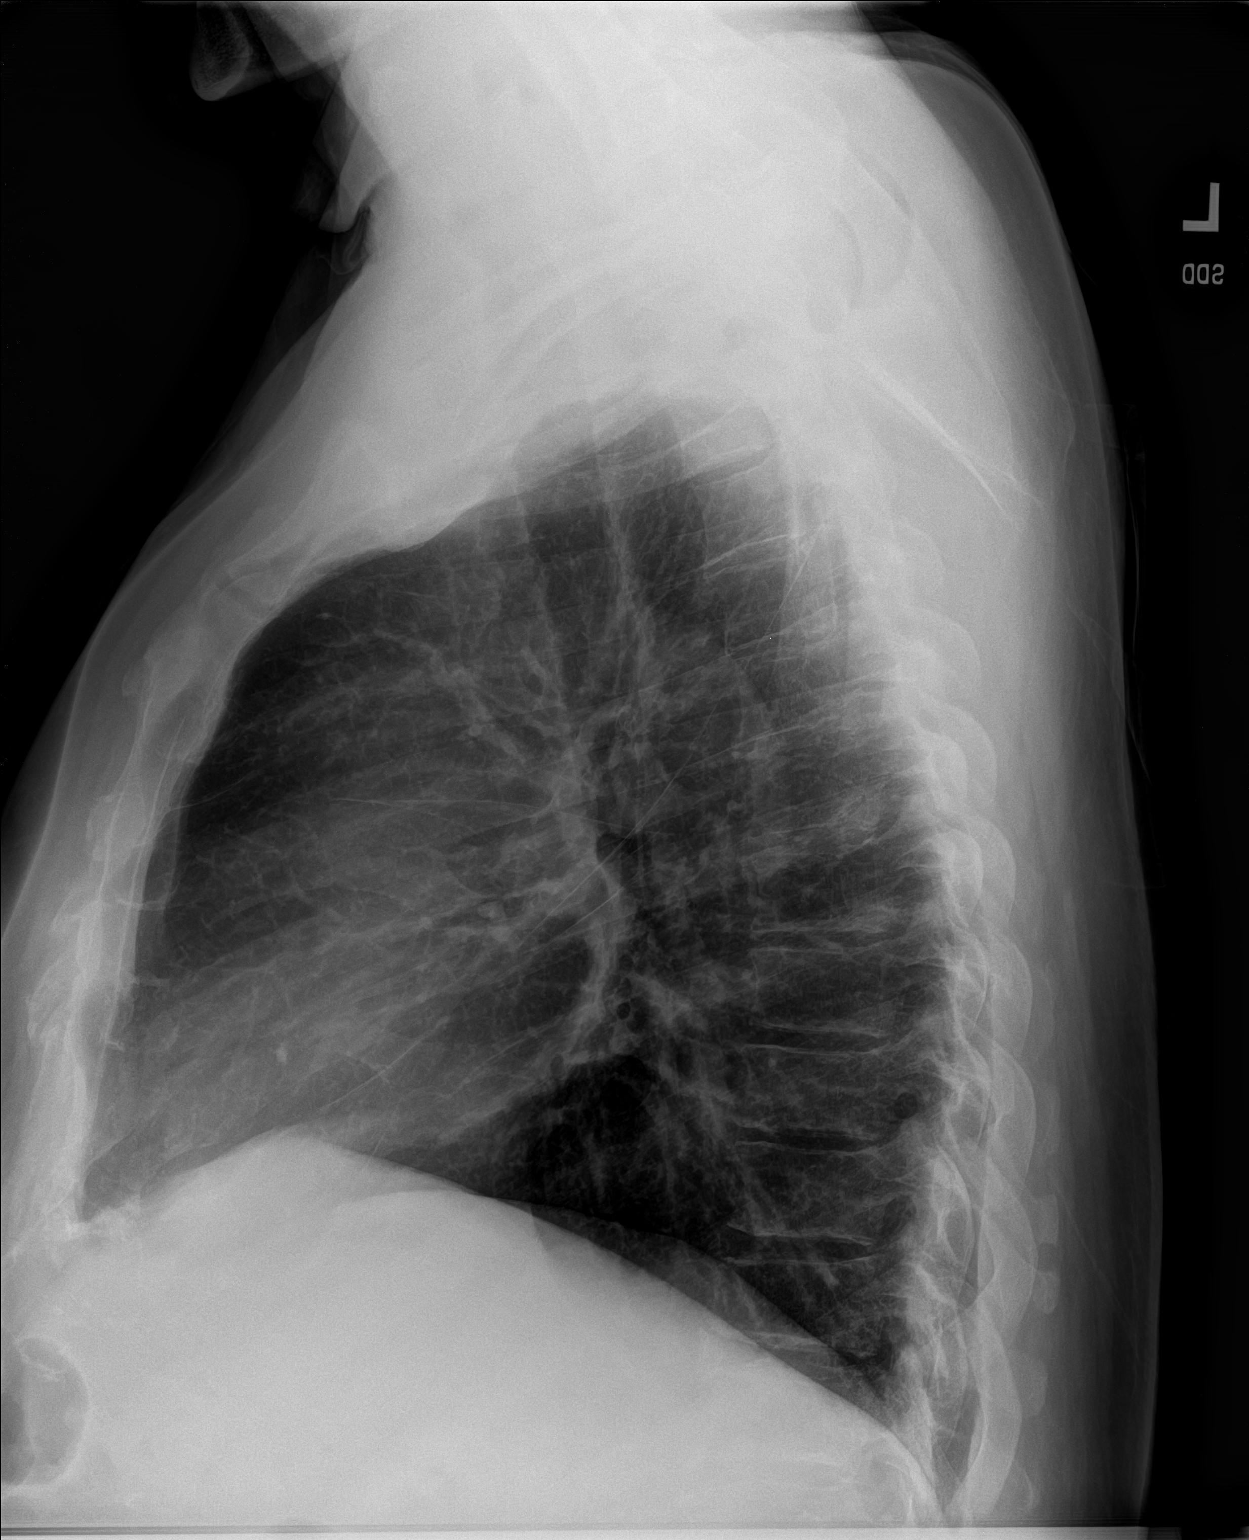

[2 of 2 positions shown; findings below may reference images not displayed]

FINDINGS: There is no focal consolidation, pleural effusion, or pneumothorax.
The cardiac silhouette is within normal limits. No acute osseous
pathology. Probable bony bridging of the anterior right lower ribs
secondary to prior fractures.
IMPRESSION: No active cardiopulmonary disease.

## 2022-02-20 IMAGING — CT CT ABD-PELV W/ CM
2 of 5 series · 16 of 46 positions shown, 18 images · IV contrast (omnipaque)
Comparison: None.

CLINICAL DATA: Left lower back pain and left flank bruising since a
fall four days ago.

EXAM:
CT ABDOMEN AND PELVIS WITH CONTRAST
TECHNIQUE: Multidetector CT imaging of the abdomen and pelvis was performed
using the standard protocol following bolus administration of
intravenous contrast.
CONTRAST:  100mL OMNIPAQUE IOHEXOL 300 MG/ML  SOLN

[Series 2: axial st · axial · 0.77mm/px · z∈[-590,-180]mm · 13 of 94 slices shown, 15 images]
[im 6/94  soft-tissue]
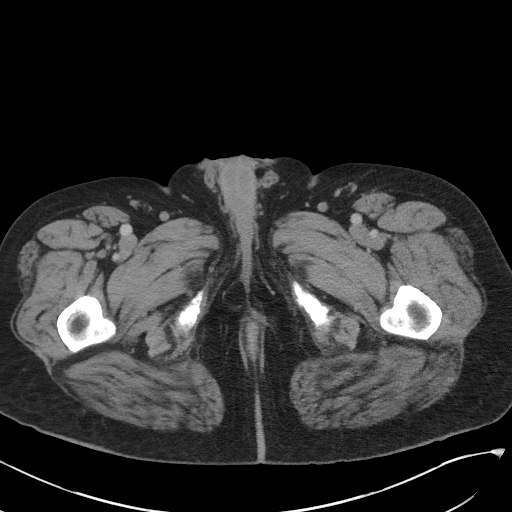
[im 6/94  bone]
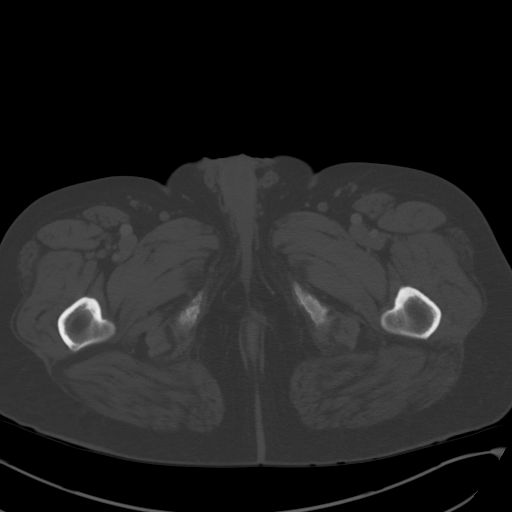
[im 12/94  soft-tissue]
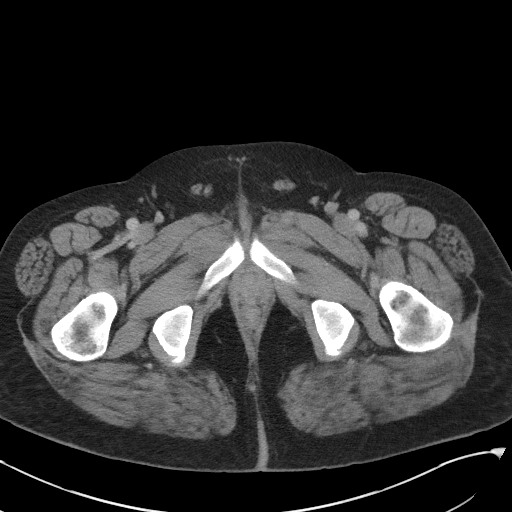
[im 18/94  soft-tissue]
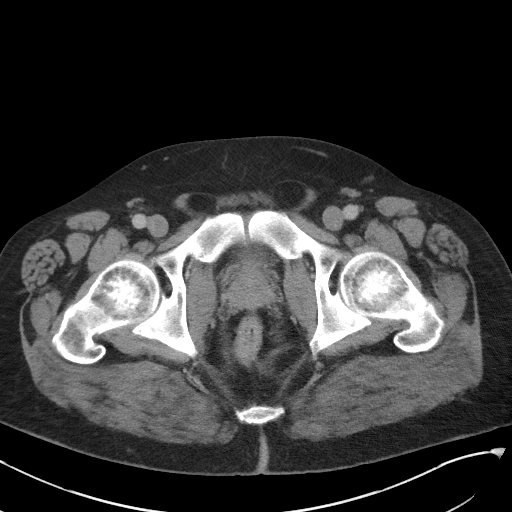
[im 30/94  soft-tissue]
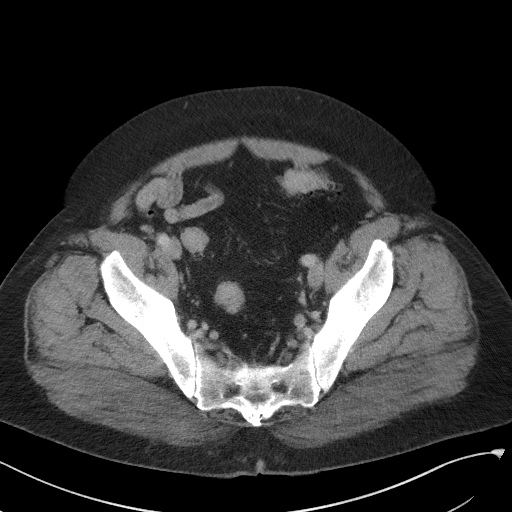
[im 35/94  soft-tissue]
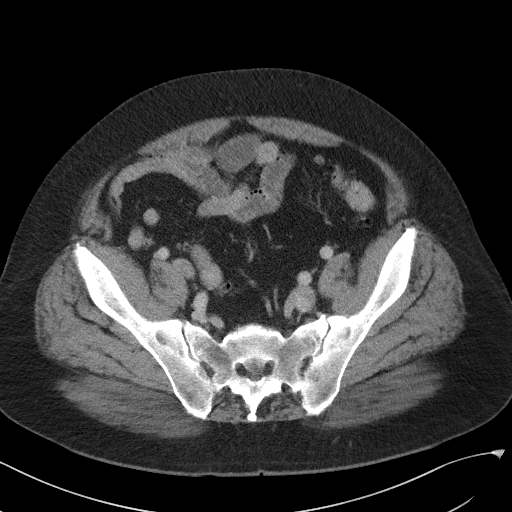
[im 41/94  soft-tissue]
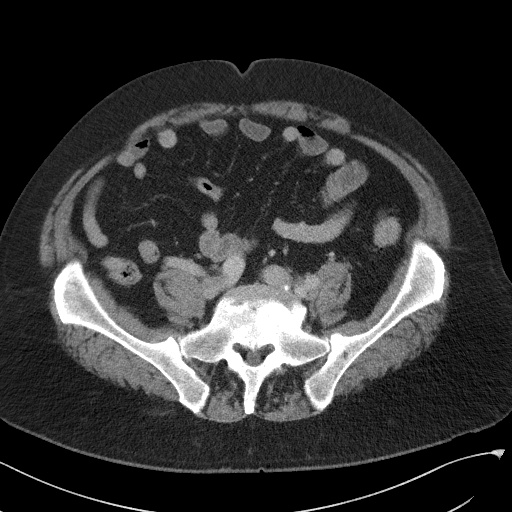
[im 47/94  soft-tissue]
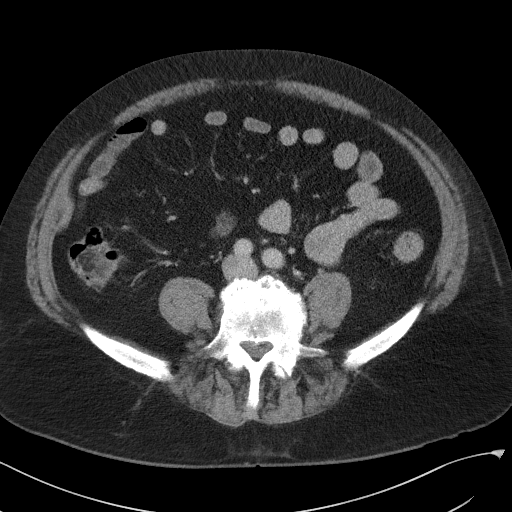
[im 53/94  soft-tissue]
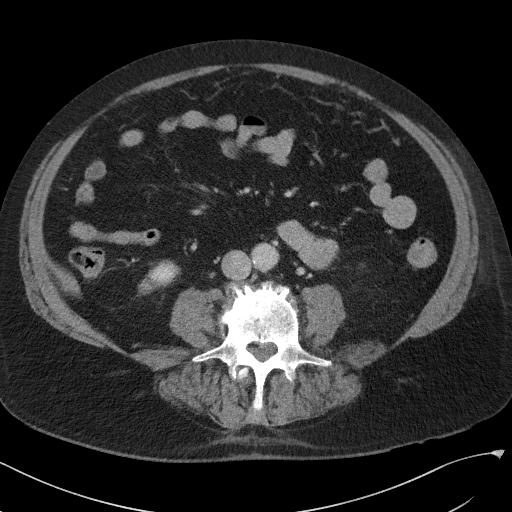
[im 59/94  soft-tissue]
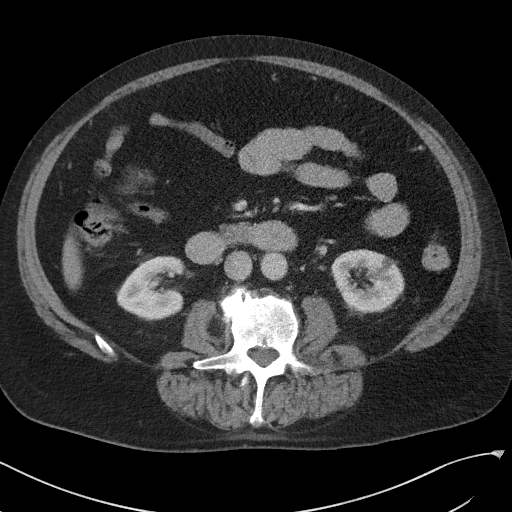
[im 59/94  bone]
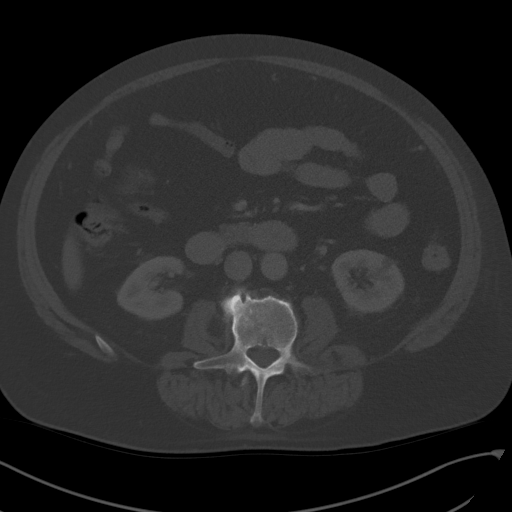
[im 64/94  soft-tissue]
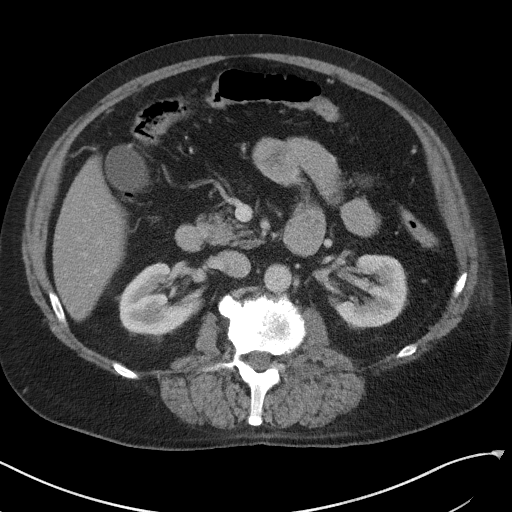
[im 76/94  soft-tissue]
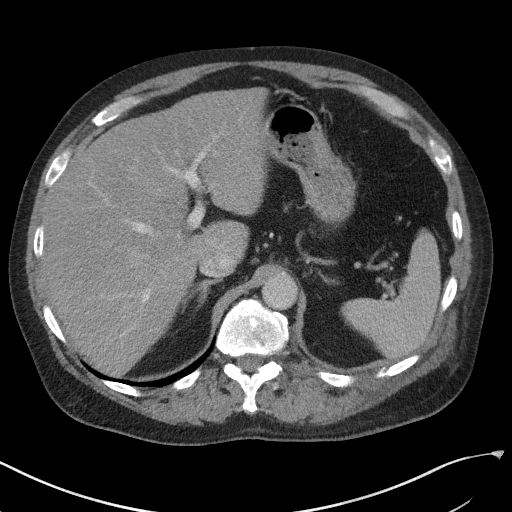
[im 82/94  soft-tissue]
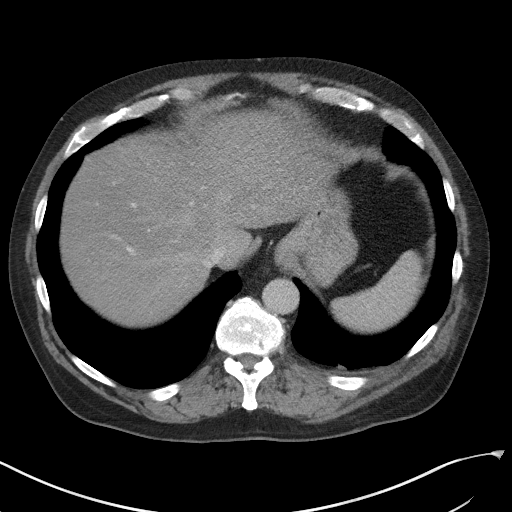
[im 88/94  soft-tissue]
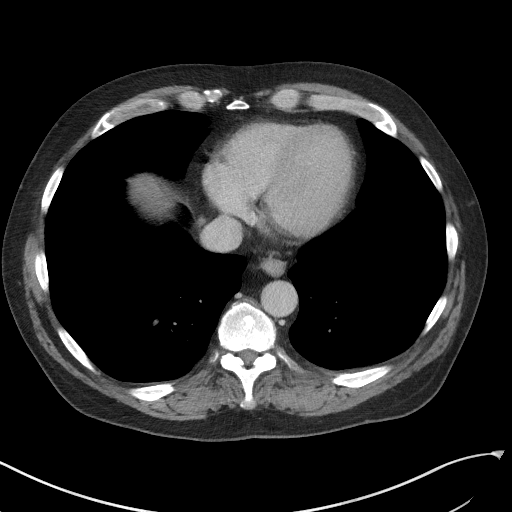

[Series 5: coronal st · coronal · 0.74mm/px · 3 of 106 slices shown]
[im 36/106  soft-tissue]
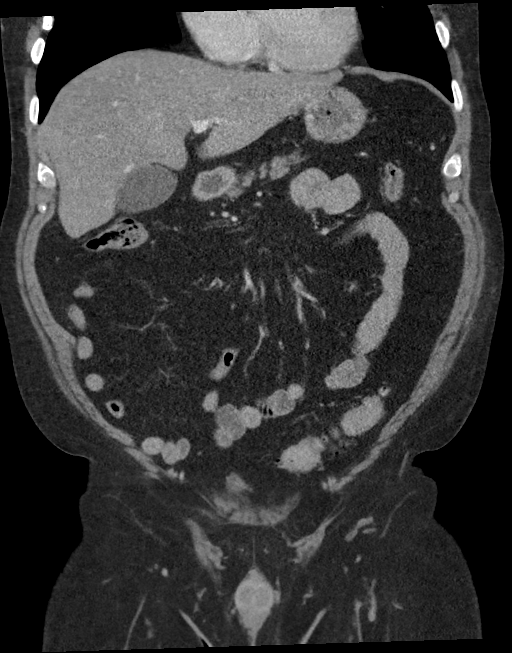
[im 47/106  soft-tissue]
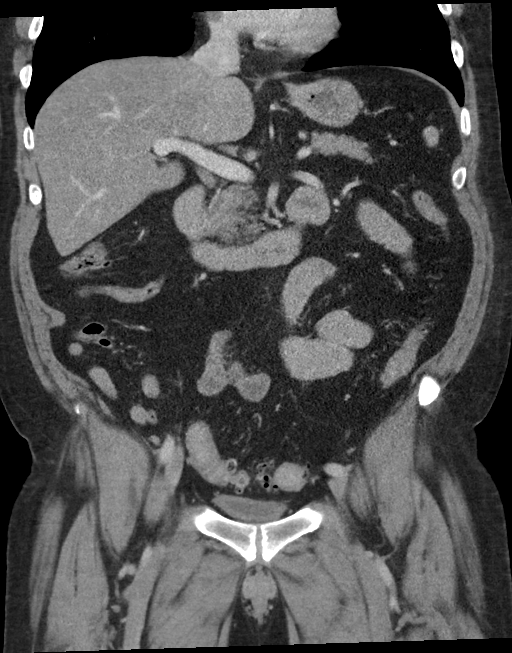
[im 59/106  soft-tissue]
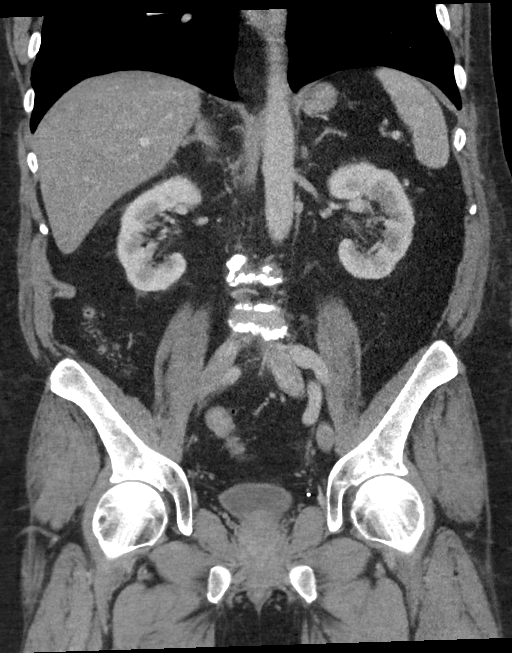

[16 of 46 positions shown; findings below may reference images not displayed]

FINDINGS: Lower chest: There are slightly displaced fractures of the posterior
aspects of the left eleventh and twelfth ribs. There are also
fractures of tip of the left transverse process of T12 and of the
left transverse process of L1 and L2. No pneumothorax. No pleural
effusion.

Hepatobiliary: No focal liver abnormality is seen. No gallstones,
gallbladder wall thickening, or biliary dilatation.

Pancreas: Unremarkable. No pancreatic ductal dilatation or
surrounding inflammatory changes.

Spleen: No splenic injury or perisplenic hematoma.

Adrenals/Urinary Tract: Adrenal glands are normal. 9 mm cyst in the
lower pole the left kidney. 12 mm cyst in the mid right kidney. No
hydronephrosis. No renal injury. Bladder is normal.

Stomach/Bowel: Stomach is within normal limits. Appendix appears
normal. No evidence of bowel wall thickening, distention, or
inflammatory changes. Extensive diverticulosis in the colon, most
extensive in the distal descending and sigmoid portions. No acute
diverticulitis.

Vascular/Lymphatic: Slight aortic atherosclerosis. No enlarged
abdominal or pelvic lymph nodes.

Reproductive: Prostate is unremarkable.

Other: There is haziness in the subcutaneous fat posterior to the
rib fractures consistent with bruising.

Musculoskeletal: Acute fractures of the left eleventh and twelfth
ribs and of the left transverse processes of T12, L1 and L2.

Diffuse degenerative disc disease in the lower lumbar spine.
IMPRESSION: 1. Acute fractures of the left eleventh and twelfth ribs and of the
left transverse processes of T12, L1, and L2.
2. No acute intra-abdominal abnormality.
3. Extensive colonic diverticulosis.

Aortic Atherosclerosis (D2YPM-3U0.0).

## 2022-07-01 ENCOUNTER — Emergency Department (HOSPITAL_COMMUNITY): Payer: 59

## 2022-07-01 ENCOUNTER — Emergency Department (HOSPITAL_COMMUNITY)
Admission: EM | Admit: 2022-07-01 | Discharge: 2022-07-01 | Disposition: A | Discharge status: Home / Self Care | Payer: 59 | Attending: Emergency Medicine | Admitting: Emergency Medicine

## 2022-07-01 DIAGNOSIS — J449 Chronic obstructive pulmonary disease, unspecified: Secondary | ICD-10-CM | POA: Diagnosis not present

## 2022-07-01 DIAGNOSIS — W19XXXA Unspecified fall, initial encounter: Secondary | ICD-10-CM | POA: Diagnosis not present

## 2022-07-01 DIAGNOSIS — Z7951 Long term (current) use of inhaled steroids: Secondary | ICD-10-CM | POA: Insufficient documentation

## 2022-07-01 DIAGNOSIS — M25511 Pain in right shoulder: Secondary | ICD-10-CM | POA: Diagnosis present

## 2022-07-01 DIAGNOSIS — S4291XA Fracture of right shoulder girdle, part unspecified, initial encounter for closed fracture: Secondary | ICD-10-CM

## 2022-07-01 DIAGNOSIS — S43014A Anterior dislocation of right humerus, initial encounter: Secondary | ICD-10-CM | POA: Diagnosis not present

## 2022-07-01 MED ORDER — PROPOFOL 10 MG/ML IV BOLUS
40.0000 mg | Freq: Once | INTRAVENOUS | Status: DC
  Filled 2022-07-01: qty 20

## 2022-07-01 MED ORDER — HYDROCODONE-ACETAMINOPHEN 5-325 MG PO TABS: 1.0000 | ORAL_TABLET | Freq: Four times a day (QID) | ORAL | 0 refills | Status: DC | PRN

## 2022-07-01 MED ORDER — ONDANSETRON HCL 4 MG/2ML IJ SOLN
4.0000 mg | Freq: Once | INTRAMUSCULAR | Status: AC
  Administered 2022-07-01: 4 mg via INTRAVENOUS
  Filled 2022-07-01: qty 2

## 2022-07-01 MED ORDER — PROPOFOL 10 MG/ML IV BOLUS
INTRAVENOUS | Status: AC | PRN
  Administered 2022-07-01: 20 mg via INTRAVENOUS
  Administered 2022-07-01: 40 mg via INTRAVENOUS
  Administered 2022-07-01: 20 mg via INTRAVENOUS

## 2022-07-01 MED ORDER — HYDROMORPHONE HCL 1 MG/ML IJ SOLN
0.5000 mg | Freq: Once | INTRAMUSCULAR | Status: AC
  Administered 2022-07-01: 0.5 mg via INTRAVENOUS
  Filled 2022-07-01: qty 0.5

## 2022-07-01 NOTE — Sedation Documentation (Signed)
Portable xray at bedside

## 2022-07-01 NOTE — Discharge Instructions (Signed)
Follow-up with Dr. Aline Brochure next week for recheck

## 2022-07-01 NOTE — ED Triage Notes (Signed)
Pt arrived from home via [facility] s/p mechanical fall approx 1 hour ago with right shoulder pain and noticeable disfigurement. Denies hitting head. 10/10 on pain scale.

## 2022-07-01 NOTE — ED Provider Notes (Signed)
[facility] Provider Note   CSN: [id number] Arrival date & time: 07/01/22  1901     History {Add pertinent medical, surgical, social history, OB history to HPI:1} Chief Complaint  Patient presents with   Fall   right shoulder pain    Joseph Jarvis is a 67 y.o. male.  Patient fell on his right shoulder.  He has a history of COPD.  Patient complains of right shoulder pain   Fall       Home Medications Prior to Admission medications   Medication Sig Start Date End Date Taking? Authorizing Provider  HYDROcodone-acetaminophen (NORCO/VICODIN) 5-325 MG tablet Take 1 tablet by mouth every 6 (six) hours as needed for moderate pain. 07/01/22  Yes Milton Ferguson, MD  albuterol (PROVENTIL HFA) 108 (90 Base) MCG/ACT inhaler Inhale 2 puffs into the lungs every 6 (six) hours as needed for wheezing. 08/16/17   [provider]  ALPRAZolam Duanne Moron) 1 MG tablet Take 1 mg by mouth 3 (three) times daily as needed for anxiety.    [provider]  FLUoxetine (PROZAC) 40 MG capsule Take 40 mg by mouth daily. 08/10/19   [provider]  fluticasone (FLONASE) 50 MCG/ACT nasal spray Place 1 spray into both nostrils daily as needed for allergies.  08/07/19   [provider]  hydrochlorothiazide (MICROZIDE) 12.5 MG capsule Take 12.5 mg by mouth daily. 07/09/19   [provider]  losartan (COZAAR) 50 MG tablet Take 50 mg by mouth daily. 08/10/19   [provider]  omeprazole (PRILOSEC) 40 MG capsule Take 40 mg by mouth daily as needed. 05/19/19   [provider]  traZODone (DESYREL) 100 MG tablet Take 200 mg by mouth at bedtime as needed for sleep.     [provider]      Allergies    Penicillins    Review of Systems   Review of Systems  Physical Exam Updated Vital Signs BP (!) 158/99   Pulse 98   Temp 98.1 F (36.7 C) (Oral)   Resp 19   SpO2 96%  Physical Exam  ED Results /  Procedures / Treatments   Labs (all labs ordered are listed, but only abnormal results are displayed) Labs Reviewed - No data to display  EKG None  Radiology DG Shoulder Right Portable  Result Date: 07/01/2022 CLINICAL DATA:  Postreduction. EXAM: RIGHT SHOULDER - 1 VIEW COMPARISON:  07/01/2022. FINDINGS: There is no evidence of fracture or dislocation. There has been satisfactory reduction of the humeral head at the glenohumeral joint. Degenerative changes are present at the acromioclavicular joint. Soft tissues are unremarkable. IMPRESSION: Successful reduction of the humeral head at the glenohumeral joint. Electronically Signed   By: Brett Fairy M.D.   On: 07/01/2022 21:20   DG Shoulder Right Port  Result Date: 07/01/2022 CLINICAL DATA:  Recent fall with right shoulder pain and deformity, initial encounter EXAM: RIGHT SHOULDER - 1 VIEW COMPARISON:  10/06/2017 FINDINGS: There is anterior inferior dislocation of the right humeral head with respect to the glenoid. Degenerative changes about the acromioclavicular joint are seen. No fracture is noted. IMPRESSION: Anterior inferior dislocation of the right humeral head as described. Electronically Signed   By: Inez Catalina M.D.   On: 07/01/2022 19:57    Procedures Procedures  {Document cardiac monitor, telemetry assessment procedure when appropriate:1}  Medications Ordered in ED Medications  propofol (DIPRIVAN) 10 mg/mL bolus/IV push 40 mg (has no administration in time range)  HYDROmorphone (  DILAUDID) injection 0.5 mg (0.5 mg Intravenous Given 07/01/22 1933)  ondansetron (ZOFRAN) injection 4 mg (4 mg Intravenous Given 07/01/22 1932)  HYDROmorphone (DILAUDID) injection 0.5 mg (0.5 mg Intravenous Given 07/01/22 2009)  propofol (DIPRIVAN) 10 mg/mL bolus/IV push (20 mg Intravenous Given 07/01/22 2048)    ED Course/ Medical Decision Making/ A&P   {   Click here for ABCD2, HEART and other calculatorsREFRESH Note before signing :1}                           Medical Decision Making Amount and/or Complexity of Data Reviewed Radiology: ordered.  Risk Prescription drug management.   Patient had a right shoulder anterior dislocation which was reduced without difficulty.  He is in a sling and will follow-up with Ortho  {Document critical care time when appropriate:1} {Document review of labs and clinical decision tools ie heart score, Chads2Vasc2 etc:1}  {Document your independent review of radiology images, and any outside records:1} {Document your discussion with family members, caretakers, and with consultants:1} {Document social determinants of health affecting pt's care:1} {Document your decision making why or why not admission, treatments were needed:1} Final Clinical Impression(s) / ED Diagnoses Final diagnoses:  Fall, initial encounter  Traumatic closed displaced fracture of right shoulder with anterior dislocation, initial encounter    Rx / DC Orders ED Discharge Orders          Ordered    HYDROcodone-acetaminophen (NORCO/VICODIN) 5-325 MG tablet  Every 6 hours PRN        07/01/22 2255

## 2022-07-06 ENCOUNTER — Ambulatory Visit (INDEPENDENT_AMBULATORY_CARE_PROVIDER_SITE_OTHER): Payer: 59 | Admitting: Orthopedic Surgery

## 2022-07-06 ENCOUNTER — Encounter: Payer: Self-pay | Admitting: Orthopedic Surgery

## 2022-07-06 VITALS — BP 145/93 | HR 91 | Ht 73.0 in | Wt 237.0 lb

## 2022-07-06 DIAGNOSIS — S43004A Unspecified dislocation of right shoulder joint, initial encounter: Secondary | ICD-10-CM

## 2022-07-06 MED ORDER — IBUPROFEN 800 MG PO TABS: 800.0000 mg | ORAL_TABLET | Freq: Three times a day (TID) | ORAL | 1 refills | Status: DC | PRN

## 2022-07-06 MED ORDER — HYDROCODONE-ACETAMINOPHEN 5-325 MG PO TABS: 1.0000 | ORAL_TABLET | Freq: Four times a day (QID) | ORAL | 0 refills | Status: AC | PRN

## 2022-07-06 NOTE — Progress Notes (Signed)
New patient ER referral   Chief Complaint  Patient presents with   Shoulder Injury    Right/ fell 07/01/22    Joseph Jarvis is 67 years old he fell when he tripped over his dog bringing the groceries in the house.  He sustained a first-time anterior shoulder dislocation on the right this was treated with closed reduction under sedation in the emergency room he presents with right shoulder pain and numbness in his ring and small finger of the right hand  Review of systems cough otherwise normal  BP (!) 145/93   Pulse 91   Ht 6' 1"$  (1.854 m)   Wt 237 lb (107.5 kg)   BMI 31.27 kg/m   Physical Exam Vitals and nursing note reviewed.  Constitutional:      Appearance: Normal appearance.  HENT:     Head: Normocephalic and atraumatic.  Eyes:     General: No scleral icterus.       Right eye: No discharge.        Left eye: No discharge.     Extraocular Movements: Extraocular movements intact.     Conjunctiva/sclera: Conjunctivae normal.     Pupils: Pupils are equal, round, and reactive to light.  Cardiovascular:     Rate and Rhythm: Normal rate.     Pulses: Normal pulses.  Skin:    General: Skin is warm and dry.     Capillary Refill: Capillary refill takes less than 2 seconds.  Neurological:     General: No focal deficit present.     Mental Status: He is alert and oriented to person, place, and time.  Psychiatric:        Mood and Affect: Mood normal.        Behavior: Behavior normal.        Thought Content: Thought content normal.        Judgment: Judgment normal.     Images read: Right shoulder immobilizer was removed.  Shoulder tenderness and swelling on the right side with decreased sensation of the ring and small finger but normal range of motion of the hand  The first image shows an anterior dislocation of the right shoulder the second image is reduction of the shoulder with no fracture.  Review of the emergency room notes indicate patient was discharged with hydrocodone and  the conscious sedation was done  Encounter Diagnosis  Name Primary?   Dislocation of right shoulder joint, initial encounter Yes    Plan 67 year old male first-time shoulder dislocation he is in the age group for cuff tears after dislocation so we have told him to take the sling off use it for comfort avoid the dislocation position and come back in a week to reexamine the right shoulder.  Add xrays rt shoulder   PDMP reviewed  Meds ordered this encounter  Medications   ibuprofen (ADVIL) 800 MG tablet    Sig: Take 1 tablet (800 mg total) by mouth every 8 (eight) hours as needed.    Dispense:  90 tablet    Refill:  1   HYDROcodone-acetaminophen (NORCO/VICODIN) 5-325 MG tablet    Sig: Take 1 tablet by mouth every 6 (six) hours as needed for up to 5 days for moderate pain.    Dispense:  20 tablet    Refill:  0

## 2022-07-06 NOTE — Patient Instructions (Signed)
Wear sling as needed   Do not lift anything heavy    Avoid position of dislocation    Rest the shoulder

## 2022-07-13 ENCOUNTER — Encounter: Payer: Self-pay | Admitting: Orthopedic Surgery

## 2022-07-13 ENCOUNTER — Ambulatory Visit (INDEPENDENT_AMBULATORY_CARE_PROVIDER_SITE_OTHER): Payer: 59 | Admitting: Orthopedic Surgery

## 2022-07-13 ENCOUNTER — Ambulatory Visit (INDEPENDENT_AMBULATORY_CARE_PROVIDER_SITE_OTHER): Payer: 59

## 2022-07-13 DIAGNOSIS — S43004A Unspecified dislocation of right shoulder joint, initial encounter: Secondary | ICD-10-CM

## 2022-07-13 MED ORDER — OXYCODONE HCL 10 MG PO TABS: 10.0000 mg | ORAL_TABLET | Freq: Two times a day (BID) | ORAL | 0 refills | Status: DC | PRN

## 2022-07-13 NOTE — Progress Notes (Signed)
Chief Complaint  Patient presents with   Shoulder Injury    Right

## 2022-07-13 NOTE — Progress Notes (Signed)
Chief Complaint  Patient presents with   Shoulder Injury    Right 07/01/22   Encounter Diagnosis  Name Primary?   Dislocation of right shoulder joint, initial encounter Yes    [place] had a right shoulder dislocation anteriorly.  He had a closed reduction emergency room is here for 2-week follow-up.  Date of injury was July 01, 2022  He still having some pain between his rotator cuff area and elbow he has some swelling and tenderness in the biceps region and proximal portion of the humerus  I was able to externally rotate his arm 30 degrees and he started having some pain but no apprehension his abduction actively was 45 degrees  There is still concerned that he may have torn his rotator cuff  I will start physical therapy on him and if he does not progress we will get an MRI and then discuss what to do if his cuff is indeed torn  Reduction in opioid therapy  Meds ordered this encounter  Medications   Oxycodone HCl 10 MG TABS    Sig: Take 1 tablet (10 mg total) by mouth every 12 (twelve) hours as needed.    Dispense:  16 tablet    Refill:  0

## 2022-07-19 ENCOUNTER — Other Ambulatory Visit: Payer: Self-pay | Admitting: Orthopedic Surgery

## 2022-07-19 DIAGNOSIS — S43004A Unspecified dislocation of right shoulder joint, initial encounter: Secondary | ICD-10-CM

## 2022-07-19 MED ORDER — OXYCODONE-ACETAMINOPHEN 5-325 MG PO TABS: 1.0000 | ORAL_TABLET | Freq: Three times a day (TID) | ORAL | 0 refills | Status: DC | PRN

## 2022-07-19 NOTE — Progress Notes (Signed)
Meds ordered this encounter  Medications   oxyCODONE-acetaminophen (PERCOCET) 5-325 MG tablet    Sig: Take 1 tablet by mouth every 8 (eight) hours as needed for up to 5 days for severe pain.    Dispense:  15 tablet    Refill:  0    Opioid reduction

## 2022-07-27 ENCOUNTER — Other Ambulatory Visit: Payer: Self-pay | Admitting: Orthopedic Surgery

## 2022-07-27 DIAGNOSIS — S43004A Unspecified dislocation of right shoulder joint, initial encounter: Secondary | ICD-10-CM

## 2022-08-09 ENCOUNTER — Other Ambulatory Visit: Payer: Self-pay | Admitting: Orthopedic Surgery

## 2022-08-09 ENCOUNTER — Telehealth: Payer: Self-pay | Admitting: Orthopedic Surgery

## 2022-08-09 DIAGNOSIS — S43004A Unspecified dislocation of right shoulder joint, initial encounter: Secondary | ICD-10-CM

## 2022-08-09 NOTE — Telephone Encounter (Signed)
Dr. Ruthe Mannan pt - patient called, stated Dr. Aline Brochure had mention PT, but he's not had any and hasn't heard anything.  Pt's # [phone]

## 2022-08-09 NOTE — Telephone Encounter (Signed)
Yes DR H said I will start physical therapy on him and if he does not progress we will get an MRI and then discuss what to do if his cuff is indeed torn   Order was missed, I have put in system and asked [facility] to call him

## 2022-08-17 ENCOUNTER — Other Ambulatory Visit: Payer: Self-pay | Admitting: Orthopedic Surgery

## 2022-08-17 ENCOUNTER — Ambulatory Visit (INDEPENDENT_AMBULATORY_CARE_PROVIDER_SITE_OTHER): Payer: 59 | Admitting: Orthopedic Surgery

## 2022-08-17 DIAGNOSIS — S43004D Unspecified dislocation of right shoulder joint, subsequent encounter: Secondary | ICD-10-CM | POA: Diagnosis not present

## 2022-08-17 DIAGNOSIS — S43004A Unspecified dislocation of right shoulder joint, initial encounter: Secondary | ICD-10-CM

## 2022-08-17 NOTE — Progress Notes (Signed)
Follow-up visit  Status post right shoulder dislocation with ER reduction  Chief Complaint  Patient presents with   Follow-up    Recheck on right shoulder   Date of injury July 01, 2022  Today patient complains of pain when he gets in the morning unrelieved by ibuprofen.  He just tapered off of an oxycodone regiment since the date of injury.  I gave him his last prescription a few days back  His active abduction is 60 degrees passive is 90 degrees internal/external rotation no stiffness there.  No neurovascular deficits.  Recommend physical therapy for 6 to 8 weeks and then follow-up for reexamination and determination to see if further imaging is needed

## 2022-08-17 NOTE — Patient Instructions (Addendum)
Physical therapy has been ordered for you at [facility] they should call you to schedule, [phone]   [phone] is the phone number to call, if you want to call to schedule.

## 2022-10-12 ENCOUNTER — Telehealth: Payer: Self-pay | Admitting: Orthopedic Surgery

## 2022-10-12 NOTE — Telephone Encounter (Signed)
Tried to return the patient's call, unable to lvm.  Pt wanted to confirm his appt for 6/03

## 2022-10-16 ENCOUNTER — Encounter: Payer: Self-pay | Admitting: Orthopedic Surgery

## 2022-10-16 ENCOUNTER — Ambulatory Visit (INDEPENDENT_AMBULATORY_CARE_PROVIDER_SITE_OTHER): Payer: 59 | Admitting: Orthopedic Surgery

## 2022-10-16 DIAGNOSIS — S43004A Unspecified dislocation of right shoulder joint, initial encounter: Secondary | ICD-10-CM

## 2022-10-16 DIAGNOSIS — S43004D Unspecified dislocation of right shoulder joint, subsequent encounter: Secondary | ICD-10-CM

## 2022-10-16 MED ORDER — IBUPROFEN 800 MG PO TABS: 800.0000 mg | ORAL_TABLET | Freq: Three times a day (TID) | ORAL | 5 refills | Status: DC | PRN

## 2022-10-16 NOTE — Progress Notes (Signed)
Follow-up visit status post closed reduction right shoulder 07/01/2022  Chief Complaint  Patient presents with   Shoulder Pain    FU for Dislocated right shoulder    67 year old male did not go to therapy because the travel was going to cost him too much money he took a 5 pound weight did his own exercises comes in with mild pain at night and mild pain with overhead activity but exam reveals that he is regained full range of motion and is not unstable  He wanted a refill on ibuprofen  I released him from care and refilled his prescription  Meds ordered this encounter  Medications   ibuprofen (ADVIL) 800 MG tablet    Sig: Take 1 tablet (800 mg total) by mouth every 8 (eight) hours as needed.    Dispense:  90 tablet    Refill:  5    This prescription was filled on 08/17/2022. Any refills authorized will be placed on file.

## 2023-02-01 ENCOUNTER — Encounter: Payer: Self-pay | Admitting: Orthopedic Surgery

## 2023-02-01 ENCOUNTER — Other Ambulatory Visit (INDEPENDENT_AMBULATORY_CARE_PROVIDER_SITE_OTHER): Payer: 59

## 2023-02-01 ENCOUNTER — Ambulatory Visit (INDEPENDENT_AMBULATORY_CARE_PROVIDER_SITE_OTHER): Payer: 59 | Admitting: Orthopedic Surgery

## 2023-02-01 VITALS — BP 120/56 | HR 104 | Ht 73.0 in | Wt 232.0 lb

## 2023-02-01 DIAGNOSIS — M48061 Spinal stenosis, lumbar region without neurogenic claudication: Secondary | ICD-10-CM

## 2023-02-01 DIAGNOSIS — M25552 Pain in left hip: Secondary | ICD-10-CM | POA: Diagnosis not present

## 2023-02-01 DIAGNOSIS — M545 Low back pain, unspecified: Secondary | ICD-10-CM

## 2023-02-01 NOTE — Patient Instructions (Signed)
Physical therapy has been ordered for you at [facility]. They should call you to schedule, [phone] is the phone number to call, if you want to call to schedule.   

## 2023-02-01 NOTE — Progress Notes (Signed)
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  Review of systems Red flags were negative  Physical Exam Vitals and nursing note reviewed.  Constitutional:      Appearance: Normal appearance.  HENT:     Head: Normocephalic and atraumatic.  Eyes:     General: No scleral icterus.       Right eye: No discharge.        Left eye: No discharge.     Extraocular Movements: Extraocular movements intact.     Conjunctiva/sclera: Conjunctivae normal.     Pupils: Pupils are equal, round, and reactive to light.  Cardiovascular:     Rate and Rhythm: Normal rate.     Pulses: Normal pulses.  Musculoskeletal:     Comments: Examination of the left hip shows that it is equal in length of the right leg he has normal range of motion in the hip in all planes with no restrictions or discomfort  He is tender in his lower back in the midline and on the left and right side and into the left hip area  Neurovascular exam is otherwise intact    Skin:    General: Skin is warm and dry.     Capillary Refill: Capillary refill takes less than 2 seconds.  Neurological:     General: No focal deficit present.     Mental Status: He is alert and oriented to person, place, and time.  Psychiatric:        Mood  and Affect: Mood normal.        Behavior: Behavior normal.        Thought Content: Thought content normal.        Judgment: Judgment normal.     Data reviewed  Referral notes indicate the patient has the medical problems including hypertension he lists his medications to include zinc Prozac Flonase hydrochlorothiazide potassium methocarbamol omeprazole trazodone Xanax  Imaging Pelvis mild degenerative changes in the left and right hip grade 1  Back x-ray shows multilevel degenerative disc changes with osteophytes and loss of lordosis and normal coronal plane alignment  Assessment and plan  Spinal stenosis with lower back pain without neurogenic claudication.  Plan as this is the first visit the patient will be encouraged to take his Robaxin daily continue with Celebrex and have physical therapy  Follow-up in 3 months  Encounter Diagnoses  Name Primary?   Pain in left hip    Lumbar pain    Spinal stenosis of lumbar region without neurogenic claudication Yes

## 2023-02-19 ENCOUNTER — Ambulatory Visit (HOSPITAL_COMMUNITY): Payer: 59

## 2023-05-01 DIAGNOSIS — G894 Chronic pain syndrome: Secondary | ICD-10-CM | POA: Insufficient documentation

## 2023-05-03 ENCOUNTER — Ambulatory Visit: Payer: 59 | Admitting: Orthopedic Surgery

## 2023-05-04 ENCOUNTER — Other Ambulatory Visit (INDEPENDENT_AMBULATORY_CARE_PROVIDER_SITE_OTHER): Payer: 59

## 2023-05-04 ENCOUNTER — Ambulatory Visit: Payer: 59 | Admitting: Orthopedic Surgery

## 2023-05-04 DIAGNOSIS — M9711XS Periprosthetic fracture around internal prosthetic right knee joint, sequela: Secondary | ICD-10-CM

## 2023-05-04 DIAGNOSIS — Z96651 Presence of right artificial knee joint: Secondary | ICD-10-CM

## 2023-05-04 DIAGNOSIS — M79661 Pain in right lower leg: Secondary | ICD-10-CM

## 2023-05-04 DIAGNOSIS — S40011A Contusion of right shoulder, initial encounter: Secondary | ICD-10-CM

## 2023-05-04 DIAGNOSIS — G8929 Other chronic pain: Secondary | ICD-10-CM

## 2023-05-04 DIAGNOSIS — M25561 Pain in right knee: Secondary | ICD-10-CM

## 2023-05-04 DIAGNOSIS — M9711XA Periprosthetic fracture around internal prosthetic right knee joint, initial encounter: Secondary | ICD-10-CM

## 2023-05-04 NOTE — Progress Notes (Signed)
Chief Complaint  Patient presents with   Back Pain    Same    Shoulder Pain    Right    67 year old male was treated for back pain had some physical therapy did well left hip and back are improved  He bumped into refrigerator has some pain in his right shoulder where he had a right shoulder injury within the last year.  It is improving  Examination of the right shoulder full passive range of motion mild pain at 120 degrees of flexion no apprehension in abduction external rotation.  No tenderness to palpation  In terms of the right knee he has multiple scars on the right knee had a right total knee and then it was complicated by periprosthetic fracture which was treated with ORIF  He complains of some pain when he is putting on his right leg  There is no pain tenderness or sign of infection  His knee feels stable  X-rays show a long periarticular periprosthetic plate in the tibia all bones healed reasonable alignment  Right knee prosthetic x-rays look normal with no loosening  Recommend observation for now  If persists further workup could be performed or consult with operating surgeons  Right shoulder should improve on its own with time  Encounter Diagnoses  Name Primary?   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

## 2023-05-04 NOTE — Progress Notes (Signed)
b

## 2023-07-24 ENCOUNTER — Other Ambulatory Visit: Payer: Self-pay | Admitting: Orthopedic Surgery

## 2023-07-24 DIAGNOSIS — S43004A Unspecified dislocation of right shoulder joint, initial encounter: Secondary | ICD-10-CM

## 2024-02-06 ENCOUNTER — Emergency Department (HOSPITAL_COMMUNITY)

## 2024-02-06 ENCOUNTER — Other Ambulatory Visit: Payer: Self-pay

## 2024-02-06 ENCOUNTER — Inpatient Hospital Stay (HOSPITAL_COMMUNITY)
Admission: EM | Admit: 2024-02-06 | Discharge: 2024-02-08 | DRG: 183 | Disposition: A | Discharge status: Home / Self Care | Attending: Family Medicine | Admitting: Family Medicine

## 2024-02-06 ENCOUNTER — Encounter (HOSPITAL_COMMUNITY): Payer: Self-pay

## 2024-02-06 DIAGNOSIS — F17210 Nicotine dependence, cigarettes, uncomplicated: Secondary | ICD-10-CM | POA: Diagnosis present

## 2024-02-06 DIAGNOSIS — F411 Generalized anxiety disorder: Secondary | ICD-10-CM | POA: Diagnosis present

## 2024-02-06 DIAGNOSIS — J209 Acute bronchitis, unspecified: Secondary | ICD-10-CM | POA: Diagnosis present

## 2024-02-06 DIAGNOSIS — R0781 Pleurodynia: Secondary | ICD-10-CM | POA: Diagnosis not present

## 2024-02-06 DIAGNOSIS — J9601 Acute respiratory failure with hypoxia: Secondary | ICD-10-CM | POA: Diagnosis present

## 2024-02-06 DIAGNOSIS — S2241XA Multiple fractures of ribs, right side, initial encounter for closed fracture: Secondary | ICD-10-CM | POA: Diagnosis present

## 2024-02-06 DIAGNOSIS — G894 Chronic pain syndrome: Secondary | ICD-10-CM | POA: Diagnosis present

## 2024-02-06 DIAGNOSIS — S2249XA Multiple fractures of ribs, unspecified side, initial encounter for closed fracture: Secondary | ICD-10-CM | POA: Diagnosis present

## 2024-02-06 DIAGNOSIS — Z79899 Other long term (current) drug therapy: Secondary | ICD-10-CM

## 2024-02-06 DIAGNOSIS — Y92009 Unspecified place in unspecified non-institutional (private) residence as the place of occurrence of the external cause: Secondary | ICD-10-CM

## 2024-02-06 DIAGNOSIS — F410 Panic disorder [episodic paroxysmal anxiety] without agoraphobia: Secondary | ICD-10-CM | POA: Diagnosis present

## 2024-02-06 DIAGNOSIS — W19XXXA Unspecified fall, initial encounter: Principal | ICD-10-CM

## 2024-02-06 DIAGNOSIS — I10 Essential (primary) hypertension: Secondary | ICD-10-CM | POA: Diagnosis present

## 2024-02-06 DIAGNOSIS — J90 Pleural effusion, not elsewhere classified: Secondary | ICD-10-CM | POA: Diagnosis present

## 2024-02-06 DIAGNOSIS — D72829 Elevated white blood cell count, unspecified: Secondary | ICD-10-CM | POA: Diagnosis present

## 2024-02-06 DIAGNOSIS — R0902 Hypoxemia: Secondary | ICD-10-CM | POA: Diagnosis present

## 2024-02-06 DIAGNOSIS — K7460 Unspecified cirrhosis of liver: Secondary | ICD-10-CM | POA: Diagnosis present

## 2024-02-06 DIAGNOSIS — K219 Gastro-esophageal reflux disease without esophagitis: Secondary | ICD-10-CM | POA: Diagnosis present

## 2024-02-06 DIAGNOSIS — Z880 Allergy status to penicillin: Secondary | ICD-10-CM

## 2024-02-06 DIAGNOSIS — E876 Hypokalemia: Secondary | ICD-10-CM | POA: Diagnosis present

## 2024-02-06 DIAGNOSIS — W010XXA Fall on same level from slipping, tripping and stumbling without subsequent striking against object, initial encounter: Secondary | ICD-10-CM | POA: Diagnosis present

## 2024-02-06 DIAGNOSIS — Z743 Need for continuous supervision: Secondary | ICD-10-CM | POA: Diagnosis not present

## 2024-02-06 DIAGNOSIS — Z96651 Presence of right artificial knee joint: Secondary | ICD-10-CM | POA: Diagnosis present

## 2024-02-06 LAB — COMPREHENSIVE METABOLIC PANEL WITH GFR
ALT: 19 U/L (ref 0–44)
AST: 16 U/L (ref 15–41)
Albumin: 3.8 g/dL (ref 3.5–5.0)
Alkaline Phosphatase: 71 U/L (ref 38–126)
Anion gap: 13 (ref 5–15)
BUN: 13 mg/dL (ref 8–23)
CO2: 28 mmol/L (ref 22–32)
Calcium: 9.4 mg/dL (ref 8.9–10.3)
Chloride: 93 mmol/L — ABNORMAL LOW (ref 98–111)
Creatinine, Ser: 0.91 mg/dL (ref 0.61–1.24)
GFR, Estimated: >60 mL/min (ref >60)
Glucose, Bld: 109 mg/dL — ABNORMAL HIGH (ref 70–99)
Potassium: 3.2 mmol/L — ABNORMAL LOW (ref 3.5–5.1)
Sodium: 134 mmol/L — ABNORMAL LOW (ref 135–145)
Total Bilirubin: 1 mg/dL (ref 0.0–1.2)
Total Protein: 7.7 g/dL (ref 6.5–8.1)

## 2024-02-06 LAB — CBC WITH DIFFERENTIAL/PLATELET
Abs Immature Granulocytes: 0.09 K/uL — ABNORMAL HIGH (ref 0.00–0.07)
Basophils Absolute: 0.1 K/uL (ref 0.0–0.1)
Basophils Relative: 1 %
Eosinophils Absolute: 0 K/uL (ref 0.0–0.5)
Eosinophils Relative: 0 %
HCT: 45.2 % (ref 39.0–52.0)
Hemoglobin: 15.6 g/dL (ref 13.0–17.0)
Immature Granulocytes: 1 %
Lymphocytes Relative: 12 %
Lymphs Abs: 1.6 K/uL (ref 0.7–4.0)
MCH: 32 pg (ref 26.0–34.0)
MCHC: 34.5 g/dL (ref 30.0–36.0)
MCV: 92.8 fL (ref 80.0–100.0)
Monocytes Absolute: 1.5 K/uL — ABNORMAL HIGH (ref 0.1–1.0)
Monocytes Relative: 11 %
Neutro Abs: 10.1 K/uL — ABNORMAL HIGH (ref 1.7–7.7)
Neutrophils Relative %: 75 %
Platelets: 318 K/uL (ref 150–400)
RBC: 4.87 MIL/uL (ref 4.22–5.81)
RDW: 14 % (ref 11.5–15.5)
WBC: 13.4 K/uL — ABNORMAL HIGH (ref 4.0–10.5)
nRBC: 0 % (ref 0.0–0.2)

## 2024-02-06 LAB — MAGNESIUM: Magnesium: 1.8 mg/dL (ref 1.7–2.4)

## 2024-02-06 LAB — CK: Total CK: 105 U/L (ref 49–397)

## 2024-02-06 MED ORDER — LORATADINE 10 MG PO TABS
10.0000 mg | ORAL_TABLET | Freq: Every day | ORAL | Status: DC
  Administered 2024-02-06 – 2024-02-08 (×3): 10 mg via ORAL
  Filled 2024-02-06 (×3): qty 1

## 2024-02-06 MED ORDER — IPRATROPIUM-ALBUTEROL 0.5-2.5 (3) MG/3ML IN SOLN
3.0000 mL | Freq: Four times a day (QID) | RESPIRATORY_TRACT | Status: DC
  Administered 2024-02-06 – 2024-02-08 (×7): 3 mL via RESPIRATORY_TRACT
  Filled 2024-02-06 (×6): qty 3

## 2024-02-06 MED ORDER — ACETAMINOPHEN 325 MG PO TABS
650.0000 mg | ORAL_TABLET | ORAL | Status: DC
  Administered 2024-02-06 – 2024-02-07 (×6): 650 mg via ORAL
  Filled 2024-02-06 (×6): qty 2

## 2024-02-06 MED ORDER — POTASSIUM CHLORIDE CRYS ER 20 MEQ PO TBCR
40.0000 meq | EXTENDED_RELEASE_TABLET | Freq: Once | ORAL | Status: AC
Start: 2024-02-06 — End: 2024-02-06
  Administered 2024-02-06: 40 meq via ORAL
  Filled 2024-02-06: qty 2

## 2024-02-06 MED ORDER — METHOCARBAMOL 1000 MG/10ML IJ SOLN: 500.0000 mg | Freq: Four times a day (QID) | INTRAMUSCULAR | Status: DC | PRN

## 2024-02-06 MED ORDER — ACETAMINOPHEN 650 MG RE SUPP: 650.0000 mg | RECTAL | Status: DC

## 2024-02-06 MED ORDER — ONDANSETRON HCL 4 MG PO TABS: 4.0000 mg | ORAL_TABLET | Freq: Four times a day (QID) | ORAL | Status: DC | PRN

## 2024-02-06 MED ORDER — HYDROCHLOROTHIAZIDE 12.5 MG PO TABS
12.5000 mg | ORAL_TABLET | Freq: Every day | ORAL | Status: DC
  Administered 2024-02-07 – 2024-02-08 (×2): 12.5 mg via ORAL
  Filled 2024-02-06 (×2): qty 1

## 2024-02-06 MED ORDER — PANTOPRAZOLE SODIUM 40 MG PO TBEC
40.0000 mg | DELAYED_RELEASE_TABLET | Freq: Every evening | ORAL | Status: DC
  Administered 2024-02-06 – 2024-02-07 (×2): 40 mg via ORAL
  Filled 2024-02-06 (×2): qty 1

## 2024-02-06 MED ORDER — NICOTINE 21 MG/24HR TD PT24
21.0000 mg | MEDICATED_PATCH | Freq: Every day | TRANSDERMAL | Status: DC
  Administered 2024-02-06 – 2024-02-08 (×3): 21 mg via TRANSDERMAL
  Filled 2024-02-06 (×3): qty 1

## 2024-02-06 MED ORDER — ONDANSETRON HCL 4 MG/2ML IJ SOLN
4.0000 mg | Freq: Once | INTRAMUSCULAR | Status: AC
  Administered 2024-02-06: 4 mg via INTRAVENOUS
  Filled 2024-02-06: qty 2

## 2024-02-06 MED ORDER — HYDRALAZINE HCL 20 MG/ML IJ SOLN: 10.0000 mg | INTRAMUSCULAR | Status: DC | PRN

## 2024-02-06 MED ORDER — TRAZODONE HCL 50 MG PO TABS
25.0000 mg | ORAL_TABLET | Freq: Every evening | ORAL | Status: DC | PRN
  Administered 2024-02-06: 25 mg via ORAL
  Filled 2024-02-06: qty 1

## 2024-02-06 MED ORDER — OXYCODONE-ACETAMINOPHEN 5-325 MG PO TABS
1.0000 | ORAL_TABLET | Freq: Once | ORAL | Status: AC
  Administered 2024-02-06: 1 via ORAL
  Filled 2024-02-06: qty 1

## 2024-02-06 MED ORDER — SENNOSIDES-DOCUSATE SODIUM 8.6-50 MG PO TABS
1.0000 | ORAL_TABLET | Freq: Every day | ORAL | Status: DC
  Administered 2024-02-06: 1 via ORAL
  Filled 2024-02-06 (×2): qty 1

## 2024-02-06 MED ORDER — LOSARTAN POTASSIUM 50 MG PO TABS
50.0000 mg | ORAL_TABLET | Freq: Every day | ORAL | Status: DC
  Administered 2024-02-07 – 2024-02-08 (×2): 50 mg via ORAL
  Filled 2024-02-06 (×2): qty 1

## 2024-02-06 MED ORDER — MORPHINE SULFATE (PF) 4 MG/ML IV SOLN
4.0000 mg | Freq: Once | INTRAVENOUS | Status: AC
  Administered 2024-02-06: 4 mg via INTRAVENOUS
  Filled 2024-02-06: qty 1

## 2024-02-06 MED ORDER — FENTANYL CITRATE (PF) 100 MCG/2ML IJ SOLN
25.0000 ug | INTRAMUSCULAR | Status: DC | PRN
  Administered 2024-02-06: 25 ug via INTRAVENOUS
  Filled 2024-02-06: qty 2

## 2024-02-06 MED ORDER — FENTANYL CITRATE (PF) 100 MCG/2ML IJ SOLN: 12.5000 ug | INTRAMUSCULAR | Status: DC | PRN

## 2024-02-06 MED ORDER — HEPARIN SODIUM (PORCINE) 5000 UNIT/ML IJ SOLN
5000.0000 [IU] | Freq: Three times a day (TID) | INTRAMUSCULAR | Status: DC
  Administered 2024-02-06 – 2024-02-08 (×5): 5000 [IU] via SUBCUTANEOUS
  Filled 2024-02-06 (×5): qty 1

## 2024-02-06 MED ORDER — OXYCODONE HCL 5 MG PO TABS
5.0000 mg | ORAL_TABLET | ORAL | Status: DC | PRN
  Administered 2024-02-06 – 2024-02-07 (×5): 5 mg via ORAL
  Filled 2024-02-06 (×5): qty 1

## 2024-02-06 MED ORDER — FLUCONAZOLE 200 MG PO TABS
200.0000 mg | ORAL_TABLET | ORAL | Status: DC
Start: 2024-02-07 — End: 2024-02-08
  Administered 2024-02-07: 200 mg via ORAL
  Filled 2024-02-06: qty 1

## 2024-02-06 MED ORDER — ALPRAZOLAM 0.5 MG PO TABS
1.0000 mg | ORAL_TABLET | Freq: Three times a day (TID) | ORAL | Status: DC | PRN
  Administered 2024-02-06: 1 mg via ORAL
  Filled 2024-02-06: qty 2

## 2024-02-06 MED ORDER — ONDANSETRON HCL 4 MG/2ML IJ SOLN: 4.0000 mg | Freq: Four times a day (QID) | INTRAMUSCULAR | Status: DC | PRN

## 2024-02-06 MED ORDER — IPRATROPIUM-ALBUTEROL 0.5-2.5 (3) MG/3ML IN SOLN: 3.0000 mL | Freq: Four times a day (QID) | RESPIRATORY_TRACT | Status: DC

## 2024-02-06 MED ORDER — FENTANYL CITRATE PF 50 MCG/ML IJ SOSY
25.0000 ug | PREFILLED_SYRINGE | INTRAMUSCULAR | Status: DC | PRN
  Administered 2024-02-06 – 2024-02-07 (×5): 25 ug via INTRAVENOUS
  Filled 2024-02-06 (×5): qty 1

## 2024-02-06 MED ORDER — FLUOXETINE HCL 20 MG PO CAPS
40.0000 mg | ORAL_CAPSULE | Freq: Every day | ORAL | Status: DC
  Administered 2024-02-07 – 2024-02-08 (×2): 40 mg via ORAL
  Filled 2024-02-06 (×2): qty 2

## 2024-02-06 MED ORDER — IOHEXOL 300 MG/ML  SOLN
100.0000 mL | Freq: Once | INTRAMUSCULAR | Status: AC | PRN
  Administered 2024-02-06: 100 mL via INTRAVENOUS

## 2024-02-06 MED ORDER — DEXTROMETHORPHAN POLISTIREX ER 30 MG/5ML PO SUER: 30.0000 mg | Freq: Two times a day (BID) | ORAL | Status: DC | PRN

## 2024-02-06 NOTE — ED Notes (Signed)
[facility] called to transport patient. Nurse notified 

## 2024-02-06 NOTE — ED Triage Notes (Signed)
 BIB [facility] from home, Patient complaint of pain from tripping over his dog and falling hitting his chest/ ribs on the floor yesterday. Denise blood thinner, 158/88 heart rate 90-98 oxygen 93% on room air.

## 2024-02-06 NOTE — ED Provider Notes (Signed)
 [place] EMERGENCY DEPARTMENT AT [facility] Provider Note   CSN: [id number] Arrival date & time: 02/06/24  9146     Patient presents with: Joseph Jarvis is a 68 y.o. male with a history including liver cirrhosis, GERD, hypertension presenting for evaluation of right sided chest wall pain since yesterday.  He was in his home when he tripped over his dog, landed with his chest against the hardwood floor and has had pain along with bruising of his right chest wall, worsened with positional changes and deep inspiration.  He denies nausea vomiting, abdominal pain, he did not hit his head, denies headache, arm or leg/hip pain or injury.  He has had no treatment prior to arrival.   The history is provided by the patient.       Prior to Admission medications   Medication Sig Start Date End Date Taking? Authorizing Provider  albuterol  (PROVENTIL  HFA) 108 (90 Base) MCG/ACT inhaler Inhale 2 puffs into the lungs every 6 (six) hours as needed for wheezing. 08/16/17  Yes [provider]  ALPRAZolam  (XANAX ) 1 MG tablet Take 1 mg by mouth 3 (three) times daily as needed for anxiety.   Yes [provider]  cetirizine (ZYRTEC) 10 MG tablet Take 10 mg by mouth daily. 01/31/24 01/30/25 Yes [provider]  fluconazole  (DIFLUCAN ) 200 MG tablet Take 200 mg by mouth 2 (two) times a week. 01/30/24  Yes [provider]  FLUoxetine  (PROZAC ) 40 MG capsule Take 40 mg by mouth daily. 08/10/19  Yes [provider]  hydrochlorothiazide  (MICROZIDE ) 12.5 MG capsule Take 12.5 mg by mouth daily. 07/09/19  Yes [provider]  HYDROcodone -acetaminophen  (NORCO/VICODIN) 5-325 MG tablet Take 1 tablet by mouth every 4 (four) hours as needed for moderate pain (pain score 4-6). 02/03/24 02/09/24 Yes [provider]  ibuprofen  (ADVIL ) 800 MG tablet TAKE ONE TABLET BY MOUTH EVERY 8 HOURS AS NEEDED Patient taking differently: Take 800 mg by mouth every 8  (eight) hours as needed for mild pain (pain score 1-3). 07/27/23  Yes Margrette Taft BRAVO, MD  losartan  (COZAAR ) 50 MG tablet Take 50 mg by mouth daily. 08/10/19  Yes [provider]  omeprazole (PRILOSEC) 40 MG capsule Take 40 mg by mouth daily. 05/19/19  Yes [provider]    Allergies: Penicillins    Review of Systems  Constitutional:  Negative for fever.  HENT:  Negative for congestion and sore throat.   Eyes: Negative.   Respiratory:  Negative for chest tightness and shortness of breath.   Cardiovascular:  Positive for chest pain.  Gastrointestinal:  Negative for abdominal pain and nausea.  Genitourinary: Negative.   Musculoskeletal:  Positive for back pain. Negative for arthralgias, joint swelling and neck pain.       Has chronic back pain, not worse today.  Skin: Negative.  Negative for rash and wound.  Neurological:  Negative for dizziness, weakness, light-headedness, numbness and headaches.  Psychiatric/Behavioral: Negative.      Updated Vital Signs BP (!) 162/96   Pulse 71   Temp 97.6 F (36.4 C) (Oral)   Resp 19   Ht 6' 1 (1.854 m)   Wt 108.9 kg   SpO2 94%   BMI 31.66 kg/m   Physical Exam Vitals and nursing note reviewed.  Constitutional:      Appearance: He is well-developed.  HENT:     Head: Normocephalic and atraumatic.  Eyes:     Conjunctiva/sclera: Conjunctivae normal.  Cardiovascular:  Rate and Rhythm: Normal rate and regular rhythm.     Heart sounds: Normal heart sounds.  Pulmonary:     Effort: Pulmonary effort is normal.     Breath sounds: Normal breath sounds. No wheezing.  Chest:     Chest wall: Tenderness present. No deformity or crepitus.       Comments: Small bruise right anterolateral chest Abdominal:     General: Bowel sounds are normal.     Palpations: Abdomen is soft.     Tenderness: There is no abdominal tenderness.  Musculoskeletal:        General: Normal range of motion.     Cervical back: Normal range of  motion.     Comments: Patient moves all extremities without pain, he is nontender to palpation C-spine T and L-spine.  Skin:    General: Skin is warm and dry.  Neurological:     Mental Status: He is alert.     (all labs ordered are listed, but only abnormal results are displayed) Labs Reviewed  CBC WITH DIFFERENTIAL/PLATELET - Abnormal; Notable for the following components:      Result Value   WBC 13.4 (*)    Neutro Abs 10.1 (*)    Monocytes Absolute 1.5 (*)    Abs Immature Granulocytes 0.09 (*)    All other components within normal limits  COMPREHENSIVE METABOLIC PANEL WITH GFR - Abnormal; Notable for the following components:   Sodium 134 (*)    Potassium 3.2 (*)    Chloride 93 (*)    Glucose, Bld 109 (*)    All other components within normal limits  CK  MAGNESIUM  HIV ANTIBODY (ROUTINE TESTING W REFLEX)    EKG: EKG Interpretation Date/Time:  Wednesday February 06 2024 09:40:44 EDT Ventricular Rate:  81 PR Interval:    QRS Duration:  105 QT Interval:  379 QTC Calculation: 440 R Axis:   7  Text Interpretation: sinus tachycardia with artifact Borderline ST elevation, anterior leads No significant change since prior 5/19 Confirmed by Towana Sharper [phone]) on 02/06/2024 9:52:29 AM  Radiology: CT CHEST ABDOMEN PELVIS W CONTRAST Result Date: 02/06/2024 CLINICAL DATA:  Polytrauma, blunt, trip and fall. Chest and rib pain. EXAM: CT CHEST, ABDOMEN, AND PELVIS WITH CONTRAST TECHNIQUE: Multidetector CT imaging of the chest, abdomen and pelvis was performed following the standard protocol during bolus administration of intravenous contrast. RADIATION DOSE REDUCTION: This exam was performed according to the departmental dose-optimization program which includes automated exposure control, adjustment of the mA and/or kV according to patient size and/or use of iterative reconstruction technique. CONTRAST:  OMNIPAQUE  IOHEXOL  300 MG/ML  SOLN COMPARISON:  August 19, 2019, 02/06/2024  FINDINGS: CT CHEST FINDINGS Pulmonary Embolism: While the exam was not optimized for the evaluation of the pulmonary arteries, no central pulmonary embolism visualized. Cardiovascular: No cardiomegaly or pericardial effusion.No aortic aneurysm. Mediastinum/Nodes: No mediastinal mass.No mediastinal, hilar, or axillary lymphadenopathy. Lungs/Pleura: The midline trachea is patent. Mild biapical pleuroparenchymal scarring. Mild bronchial wall thickening in the left lower lobe. Trace right pleural effusion. Bibasilar posterior dependent atelectasis. Minimal posterior dependent atelectasis also present in the right upper lobe. No pneumothorax. CT ABDOMEN PELVIS FINDINGS Hepatobiliary: No mass.No radiopaque stones or wall thickening of the gallbladder. No intrahepatic or extrahepatic biliary ductal dilation. The portal veins are patent. Pancreas: No mass or main ductal dilation. No peripancreatic inflammation or fluid collection. Spleen: Normal size. No mass. Adrenals/Urinary Tract: No adrenal masses. Similar appearance of a couple of small renal cysts bilaterally. No nephrolithiasis  or hydronephrosis. The urinary bladder is completely decompressed. Stomach/Bowel: The stomach is decompressed without focal abnormality. No small bowel wall thickening or inflammation. No small bowel obstruction.Normal appendix. Total colonic diverticulosis. No changes of acute diverticulitis. Vascular/Lymphatic: No aortic aneurysm. Scattered aortoiliac atherosclerosis. No intraabdominal or pelvic lymphadenopathy. Reproductive: No prostatomegaly.No free pelvic fluid. Other: No pneumoperitoneum, ascites, or mesenteric inflammation. Musculoskeletal: Nondisplaced buckle type fracture of the anterolateral right third rib (axial 54). Mildly displaced fracture of the lateral right fourth rib with moderately displaced fractures of the lateral 5-7 ribs. Remote, healing fracture of the anterolateral right seventh rib. Remote, healed fractures of  multiple anterior and posterior left ribs. Multilevel degenerative disc disease of the spine. Remote posttraumatic changes of the sternum. IMPRESSION: 1. Nondisplaced buckle type fracture of the anterolateral right third rib with displaced, nonsegmental fractures of the lateral right 4-7 ribs, as described above. Trace right pleural effusion. No pneumothorax. 2. Mild bronchial wall thickening in the left lower lobe, which may represent changes of asthma or bronchitis. No superimposed findings of pneumonia. 3. No acute, traumatic injury within the abdomen or pelvis. Aortic Atherosclerosis (ICD10-I70.0). Electronically Signed   By: Rogelia Myers M.D.   On: 02/06/2024 14:00   DG Ribs Unilateral W/Chest Right Result Date: 02/06/2024 CLINICAL DATA:  right chest pain after fall EXAM: RIGHT RIBS AND CHEST - 3+ VIEW COMPARISON:  08/19/2019 FINDINGS: No focal airspace consolidation, pleural effusion, or pneumothorax. No cardiomegaly. There are 3, mildly displaced mid right thoracic rib fractures, the numbering of which is difficult to ascertain on the provided radiographs due to patient positioning. IMPRESSION: Three midthoracic, mildly displaced right-sided rib fractures, the numbering of which is difficult to ascertain on the provided radiographs. No pneumothorax or focal airspace consolidation. Electronically Signed   By: Rogelia Myers M.D.   On: 02/06/2024 11:20     Procedures   Medications Ordered in the ED  heparin  injection 5,000 Units (has no administration in time range)  acetaminophen  (TYLENOL ) tablet 650 mg (has no administration in time range)    Or  acetaminophen  (TYLENOL ) suppository 650 mg (has no administration in time range)  oxyCODONE  (Oxy IR/ROXICODONE ) immediate release tablet 5 mg (has no administration in time range)  fentaNYL  (SUBLIMAZE ) injection 12.5 mcg (has no administration in time range)  methocarbamol  (ROBAXIN ) injection 500 mg (has no administration in time range)   traZODone  (DESYREL ) tablet 25 mg (has no administration in time range)  senna-docusate (Senokot-S) tablet 1 tablet (has no administration in time range)  ondansetron  (ZOFRAN ) tablet 4 mg (has no administration in time range)    Or  ondansetron  (ZOFRAN ) injection 4 mg (has no administration in time range)  potassium chloride  SA (KLOR-CON  M) CR tablet 40 mEq (has no administration in time range)  hydrALAZINE  (APRESOLINE ) injection 10 mg (has no administration in time range)  oxyCODONE -acetaminophen  (PERCOCET/ROXICET) 5-325 MG per tablet 1 tablet (1 tablet Oral Given 02/06/24 0953)  morphine  (PF) 4 MG/ML injection 4 mg (4 mg Intravenous Given 02/06/24 1215)  ondansetron  (ZOFRAN ) injection 4 mg (4 mg Intravenous Given 02/06/24 1215)  iohexol  (OMNIPAQUE ) 300 MG/ML solution 100 mL (100 mLs Intravenous Contrast Given 02/06/24 1302)                                    Medical Decision Making Patient with reproducible right-sided chest wall pain after a trip and fall over his dog yesterday in his home.  Denies head injury,  denies any other complaint of pain.  Plain film imaging suggesting 3 rib fractures right lateral rib cage, CT imaging was also ordered with results below.  Amount and/or Complexity of Data Reviewed Labs: ordered.    Details: Labs reviewed including CBC and CMET, significant for hypokalemia potassium of 3.2, his glucose is 109, sodium of 134, he has a WBC count of 13.4. Radiology: ordered.    Details: Initial chest x-ray with right rib detail suggesting 3 rib fractures, mildly displaced.  CT chest abdomen pelvis obtained to obtain more detail, he actually has rib fractures 3 through 7 right, trace pleural effusion right with no trauma within abdomen and pelvis. Discussion of management or test interpretation with external provider(s): Patient's injuries were discussed with Dr. Kallie, she defers to trauma, she is comfortable with patient staying here however would defer to the trauma  teams recommendations.  Spoke with Dr. Rubin with the trauma service who recommends patient come down to them under the hospitalist service for observation.  Patient was discussed with Dr. Vicci who accepts patient for admission/transfer to [facility].  Risk Decision regarding hospitalization.        Final diagnoses:  Fall, initial encounter  Closed fracture of multiple ribs of right side, initial encounter    ED Discharge Orders     None          Birdena Mliss RIGGERS 02/06/24 1534    Towana Ozell BROCKS, MD 02/06/24 1718

## 2024-02-06 NOTE — H&P (Addendum)
 History and Physical  [facility]  Joseph Jarvis FMW:[id number] DOB: [birth year] DOA: 02/06/2024  PCP: Teresa Aldona CROME, NP  Patient coming from: Home  Level of care: Med-Surg  I have personally briefly reviewed patient's old medical records in [facility]  Chief Complaint: chest pain   HPI: Joseph Jarvis is a 68 year old male with history of chronic pain syndrome, liver cirrhosis, GAD, GERD, hypertension was feeling his normal self yesterday but unfortunately had an accident at home where he ended up tripping over his pet dog.  The patient reported that he landed flat on his chest against the hardwood floor and he has been experiencing bruising on the right side of his chest wall and significant pain especially with coughing and deep inspiration and changes in positioning.  Patient reports no head injuries.  He presented to the emergency department due to uncontrolled pain.  He denies fever or chills.  He was evaluated in the ED and noted to have multiple displaced and nondisplaced rib fractures.  Trauma surgery was consulted at [facility] and requested that he be admitted for observation at [facility] to be seen by the trauma service.  Pain management has been started and patient has been started on some supplemental oxygen after he received initial dose of IV pain medication in the ED he desaturated.   Past Medical History:  Diagnosis Date   Arthritis    Back pain    Compartment syndrome of right lower extremity 07/12/2015   lower leg    GERD (gastroesophageal reflux disease)    Hypertension    Panic attack    Periprosthetic fracture of proximal end of tibia, right 07/12/2015   right    Past Surgical History:  Procedure Laterality Date   APPLICATION OF WOUND VAC Right 07/06/2015   Procedure:  WOUND VAC PLACEMENT;  Surgeon: Ozell Bruch, MD;  Location: [facility] OR;  Service: Orthopedics;  Laterality: Right;   DORSAL COMPARTMENT RELEASE Right 07/03/2015   Procedure: COMPARTMENT  SYNDROME RELEASE RIGHT LOWER LEG, APPLICATION OF LARGE EXTERNAL FIXATOR;  Surgeon: Lonni CINDERELLA Poli, MD;  Location: [facility] OR;  Service: Orthopedics;  Laterality: Right;   ORIF TIBIA FRACTURE Right 07/03/2015   ORIF TIBIA PLATEAU Right 07/06/2015   Procedure: OPEN REDUCTION INTERNAL FIXATION (ORIF) TIBIAL PLATEAU;  Surgeon: Ozell Bruch, MD;  Location: [facility] OR;  Service: Orthopedics;  Laterality: Right;   SECONDARY CLOSURE OF WOUND Right 07/06/2015   Procedure: SECONDARY CLOSURE OF WOUND;  Surgeon: Ozell Bruch, MD;  Location: [facility] OR;  Service: Orthopedics;  Laterality: Right;   SKIN SPLIT GRAFT Right 07/09/2015   Procedure: RIGHT LEG SPLIT THICKNESS SKIN GRAFT;  Surgeon: Ozell Bruch, MD;  Location: [facility] OR;  Service: Orthopedics;  Laterality: Right;   TOTAL KNEE ARTHROPLASTY Right 10/20/2013   Procedure: TOTAL KNEE ARTHROPLASTY;  Surgeon: Norleen CROME Gavel, MD;  Location: [facility] OR;  Service: Orthopedics;  Laterality: Right;     reports that he has been smoking cigarettes. He has a 30 pack-year smoking history. He has never used smokeless tobacco. He reports current alcohol use. He reports that he does not use drugs.  Allergies  Allergen Reactions   Penicillins Swelling    Sudden or severe rash/hives, skin peeling, or any reaction on the inside of your mouth or nose Childhood allergy    History reviewed. No pertinent family history.  Prior to Admission medications   Medication Sig Start Date End Date Taking? Authorizing Provider  albuterol  (PROVENTIL  HFA) 108 (90 Base) MCG/ACT  inhaler Inhale 2 puffs into the lungs every 6 (six) hours as needed for wheezing. 08/16/17  Yes [provider]  ALPRAZolam  (XANAX ) 1 MG tablet Take 1 mg by mouth 3 (three) times daily as needed for anxiety.   Yes [provider]  fluconazole  (DIFLUCAN ) 200 MG tablet Take 200 mg by mouth 2 (two) times a week. 01/30/24  Yes [provider]  FLUoxetine  (PROZAC ) 40 MG capsule Take 40 mg by mouth daily.  08/10/19  Yes [provider]  HYDROcodone -acetaminophen  (NORCO/VICODIN) 5-325 MG tablet Take 1 tablet by mouth every 4 (four) hours as needed for moderate pain (pain score 4-6). 02/03/24 02/09/24 Yes [provider]  ibuprofen  (ADVIL ) 800 MG tablet TAKE ONE TABLET BY MOUTH EVERY 8 HOURS AS NEEDED Patient taking differently: Take 800 mg by mouth every 8 (eight) hours as needed for mild pain (pain score 1-3). 07/27/23  Yes Margrette Taft BRAVO, MD  celecoxib (CELEBREX) 100 MG capsule Take by mouth. Patient not taking: Reported on 02/06/2024 01/22/23   [provider]  cetirizine (ZYRTEC) 10 MG tablet Take 10 mg by mouth daily. 01/31/24 01/30/25  [provider]  fluticasone (FLONASE) 50 MCG/ACT nasal spray Place 1 spray into both nostrils daily as needed for allergies.  08/07/19   [provider]  hydrochlorothiazide  (MICROZIDE ) 12.5 MG capsule Take 12.5 mg by mouth daily. 07/09/19   [provider]  losartan  (COZAAR ) 50 MG tablet Take 50 mg by mouth daily. 08/10/19   [provider]  methocarbamol  (ROBAXIN ) 500 MG tablet Take 500 mg by mouth 3 (three) times daily. 08/07/22   [provider]  omeprazole (PRILOSEC) 40 MG capsule Take 40 mg by mouth daily as needed. 05/19/19   [provider]  traZODone  (DESYREL ) 100 MG tablet Take 200 mg by mouth at bedtime as needed for sleep.  Patient not taking: Reported on 02/06/2024    [provider]    Physical Exam: Vitals:   02/06/24 1315 02/06/24 1330 02/06/24 1345 02/06/24 1440  BP:    (!) 160/86  Pulse: 84 73 76 92  Resp:    18  Temp:    97.6 F (36.4 C)  TempSrc:    Oral  SpO2: 94% 93% 94% 92%  Weight:      Height:        Constitutional: NAD, calm, comfortable Eyes: PERRL, lids and conjunctivae normal ENMT: Mucous membranes are moist. Posterior pharynx clear of any exudate or lesions.Normal dentition.  Neck: normal, supple, no masses, no thyromegaly Respiratory:  shallow breathing, bruising right chest wall, clear to auscultation bilaterally, no wheezing, no crackles. Normal respiratory effort. No accessory muscle use.  Cardiovascular: normal s1, s2 sounds, no murmurs / rubs / gallops. No extremity edema. 2+ pedal pulses. No carotid bruits.  Abdomen: no tenderness, no masses palpated. No hepatosplenomegaly. Bowel sounds positive.  Musculoskeletal: no clubbing / cyanosis. No joint deformity upper and lower extremities. Good ROM, no contractures. Normal muscle tone.  Skin: no rashes, lesions, ulcers. No induration Neurologic: CN 2-12 grossly intact. Sensation intact, DTR normal. Strength 5/5 in all 4.  Psychiatric: Normal judgment and insight. Alert and oriented x 3. Normal mood.   Labs on Admission: I have personally reviewed following labs and imaging studies  CBC: Recent Labs  Lab 02/06/24 1208  WBC 13.4*  NEUTROABS 10.1*  HGB 15.6  HCT 45.2  MCV 92.8  PLT 318   Basic Metabolic Panel: Recent Labs  Lab 02/06/24 1208  NA 134*  K  3.2*  CL 93*  CO2 28  GLUCOSE 109*  BUN 13  CREATININE 0.91  CALCIUM 9.4   GFR: Estimated Creatinine Clearance: 97.8 mL/min (by C-G formula based on SCr of 0.91 mg/dL). Liver Function Tests: Recent Labs  Lab 02/06/24 1208  AST 16  ALT 19  ALKPHOS 71  BILITOT 1.0  PROT 7.7  ALBUMIN 3.8   No results for input(s): LIPASE, AMYLASE in the last 168 hours. No results for input(s): AMMONIA in the last 168 hours. Coagulation Profile: No results for input(s): INR, PROTIME in the last 168 hours. Cardiac Enzymes: No results for input(s): CKTOTAL, CKMB, CKMBINDEX, TROPONINI in the last 168 hours. BNP (last 3 results) No results for input(s): PROBNP in the last 8760 hours. HbA1C: No results for input(s): HGBA1C in the last 72 hours. CBG: No results for input(s): GLUCAP in the last 168 hours. Lipid Profile: No results for input(s): CHOL, HDL, LDLCALC, TRIG, CHOLHDL,  LDLDIRECT in the last 72 hours. Thyroid Function Tests: No results for input(s): TSH, T4TOTAL, FREET4, T3FREE, THYROIDAB in the last 72 hours. Anemia Panel: No results for input(s): VITAMINB12, FOLATE, FERRITIN, TIBC, IRON, RETICCTPCT in the last 72 hours. Urine analysis:    Component Value Date/Time   COLORURINE YELLOW 08/19/2019 1552   APPEARANCEUR CLEAR 08/19/2019 1552   LABSPEC 1.010 08/19/2019 1552   PHURINE 7.0 08/19/2019 1552   GLUCOSEU NEGATIVE 08/19/2019 1552   HGBUR NEGATIVE 08/19/2019 1552   BILIRUBINUR MODERATE (A) 08/19/2019 1552   KETONESUR TRACE (A) 08/19/2019 1552   PROTEINUR 100 (A) 08/19/2019 1552   UROBILINOGEN TEST REQUEST RECEIVED WITHOUT APPROPRIATE SPECIMEN 10/20/2013 1105   NITRITE NEGATIVE 08/19/2019 1552   LEUKOCYTESUR TRACE (A) 08/19/2019 1552    Radiological Exams on Admission: CT CHEST ABDOMEN PELVIS W CONTRAST Result Date: 02/06/2024 CLINICAL DATA:  Polytrauma, blunt, trip and fall. Chest and rib pain. EXAM: CT CHEST, ABDOMEN, AND PELVIS WITH CONTRAST TECHNIQUE: Multidetector CT imaging of the chest, abdomen and pelvis was performed following the standard protocol during bolus administration of intravenous contrast. RADIATION DOSE REDUCTION: This exam was performed according to the departmental dose-optimization program which includes automated exposure control, adjustment of the mA and/or kV according to patient size and/or use of iterative reconstruction technique. CONTRAST:  OMNIPAQUE  IOHEXOL  300 MG/ML  SOLN COMPARISON:  August 19, 2019, 02/06/2024 FINDINGS: CT CHEST FINDINGS Pulmonary Embolism: While the exam was not optimized for the evaluation of the pulmonary arteries, no central pulmonary embolism visualized. Cardiovascular: No cardiomegaly or pericardial effusion.No aortic aneurysm. Mediastinum/Nodes: No mediastinal mass.No mediastinal, hilar, or axillary lymphadenopathy. Lungs/Pleura: The midline trachea is patent. Mild  biapical pleuroparenchymal scarring. Mild bronchial wall thickening in the left lower lobe. Trace right pleural effusion. Bibasilar posterior dependent atelectasis. Minimal posterior dependent atelectasis also present in the right upper lobe. No pneumothorax. CT ABDOMEN PELVIS FINDINGS Hepatobiliary: No mass.No radiopaque stones or wall thickening of the gallbladder. No intrahepatic or extrahepatic biliary ductal dilation. The portal veins are patent. Pancreas: No mass or main ductal dilation. No peripancreatic inflammation or fluid collection. Spleen: Normal size. No mass. Adrenals/Urinary Tract: No adrenal masses. Similar appearance of a couple of small renal cysts bilaterally. No nephrolithiasis or hydronephrosis. The urinary bladder is completely decompressed. Stomach/Bowel: The stomach is decompressed without focal abnormality. No small bowel wall thickening or inflammation. No small bowel obstruction.Normal appendix. Total colonic diverticulosis. No changes of acute diverticulitis. Vascular/Lymphatic: No aortic aneurysm. Scattered aortoiliac atherosclerosis. No intraabdominal or pelvic lymphadenopathy. Reproductive: No prostatomegaly.No free pelvic fluid. Other: No pneumoperitoneum, ascites,  or mesenteric inflammation. Musculoskeletal: Nondisplaced buckle type fracture of the anterolateral right third rib (axial 54). Mildly displaced fracture of the lateral right fourth rib with moderately displaced fractures of the lateral 5-7 ribs. Remote, healing fracture of the anterolateral right seventh rib. Remote, healed fractures of multiple anterior and posterior left ribs. Multilevel degenerative disc disease of the spine. Remote posttraumatic changes of the sternum. IMPRESSION: 1. Nondisplaced buckle type fracture of the anterolateral right third rib with displaced, nonsegmental fractures of the lateral right 4-7 ribs, as described above. Trace right pleural effusion. No pneumothorax. 2. Mild bronchial wall  thickening in the left lower lobe, which may represent changes of asthma or bronchitis. No superimposed findings of pneumonia. 3. No acute, traumatic injury within the abdomen or pelvis. Aortic Atherosclerosis (ICD10-I70.0). Electronically Signed   By: Rogelia Myers M.D.   On: 02/06/2024 14:00   DG Ribs Unilateral W/Chest Right Result Date: 02/06/2024 CLINICAL DATA:  right chest pain after fall EXAM: RIGHT RIBS AND CHEST - 3+ VIEW COMPARISON:  08/19/2019 FINDINGS: No focal airspace consolidation, pleural effusion, or pneumothorax. No cardiomegaly. There are 3, mildly displaced mid right thoracic rib fractures, the numbering of which is difficult to ascertain on the provided radiographs due to patient positioning. IMPRESSION: Three midthoracic, mildly displaced right-sided rib fractures, the numbering of which is difficult to ascertain on the provided radiographs. No pneumothorax or focal airspace consolidation. Electronically Signed   By: Rogelia Myers M.D.   On: 02/06/2024 11:20    EKG: Independently reviewed.   Assessment/Plan Principal Problem:   Multiple rib fractures Active Problems:   Pleuritic chest pain   Hypoxia   Acute respiratory failure with hypoxia   GERD (gastroesophageal reflux disease)   Hypertension   GAD (generalized anxiety disorder)   Chronic pain syndrome   Leukocytosis   Hypokalemia   Nondisplaced buckle type fracture of the anterolateral right third rib with displaced, nonsegmental fractures of the lateral right 4-7 ribs -- fortunately no pneumothorax found -- Trauma team consulted and requested patient be sent to [facility] for consultation -- Continue supportive management as ordered, acetaminophen  650 every 4 hours around-the-clock, cough suppressants and mucolytic's ordered, bronchodilators ordered, oxycodone  5 mg every 4 hours as needed moderate pain, fentanyl  IV 25 mcg 2 hours as needed severe pain -- PT/OT eval tomorrow  -- Fall precautions -- mucolytic  ordered  Acute bronchitis -- supportive care with bronchodilators, cough suppressants  Acute respiratory failure with hypoxia -- this happened shortly after the IV morphine  was given in ED -- suspect from IV opioid use -- wean supplemental oxygen to room air as able   Leukocytosis -- likely reactive due to fall and acute rib fractures -- follow CBC with diff  Hypokalemia -- repleted, check Mg  GERD -- pantoprazole  ordered  Essential hypertension  -- resume home medication management -- IV hydralazine  ordered as needed  Tobacco use -- nicotine  patch ordered (21 mg)  DVT prophylaxis: sq heparin    Code Status: Full   Family Communication: son at bedside   Disposition Plan: transfer to [facility]   Consults called: trauma service   Admission status: OBV Time spent: 64 mins  Level of care: Med-Surg Afton Louder MD [facility] How to contact the [facility] Attending or Consulting provider 7A - 7P or covering provider during after hours 7P -7A, for this patient?  Check the care team in [facility] and look for a) attending/consulting TRH provider listed and b) the TRH team listed Log into [email or web address] and use [place]'s  universal password to access. If you do not have the password, please contact the hospital operator. Locate the TRH provider you are looking for under [facility] and page to a number that you can be directly reached. If you still have difficulty reaching the provider, please page the [facility] (Director on Call) for the Hospitalists listed on amion for assistance.   If 7PM-7AM, please contact night-coverage [email or web address] Password [email or web address]  02/06/2024, 3:08 PM

## 2024-02-06 NOTE — ED Notes (Signed)
 Patient put on 3L nasal cannula due to desating to 84% on room air. MD made aware.

## 2024-02-06 NOTE — Hospital Course (Addendum)
 68 year old male with history of chronic pain syndrome, liver cirrhosis, GAD, GERD, hypertension was feeling his normal self yesterday but unfortunately had an accident at home where he ended up tripping over his pet dog.  The patient reported that he landed flat on his chest against the hardwood floor and he has been experiencing bruising on the right side of his chest wall and significant pain especially with coughing and deep inspiration and changes in positioning.  Patient reports no head injuries.  He presented to the emergency department due to uncontrolled pain.  He denies fever or chills.  He was evaluated in the ED and noted to have multiple displaced and nondisplaced rib fractures.  Trauma surgery was consulted at [facility] and requested that he be admitted for observation at [facility] to be seen by the trauma service.  Pain management has been started and patient has been started on some supplemental oxygen after he received initial dose of IV pain medication in the ED he desaturated.

## 2024-02-07 ENCOUNTER — Observation Stay (HOSPITAL_COMMUNITY)

## 2024-02-07 DIAGNOSIS — R0781 Pleurodynia: Secondary | ICD-10-CM | POA: Diagnosis not present

## 2024-02-07 DIAGNOSIS — S2241XA Multiple fractures of ribs, right side, initial encounter for closed fracture: Secondary | ICD-10-CM | POA: Diagnosis not present

## 2024-02-07 DIAGNOSIS — J9601 Acute respiratory failure with hypoxia: Secondary | ICD-10-CM

## 2024-02-07 DIAGNOSIS — G894 Chronic pain syndrome: Secondary | ICD-10-CM | POA: Diagnosis not present

## 2024-02-07 LAB — CBC WITH DIFFERENTIAL/PLATELET
Abs Immature Granulocytes: 0.07 K/uL (ref 0.00–0.07)
Basophils Absolute: 0.1 K/uL (ref 0.0–0.1)
Basophils Relative: 0 %
Eosinophils Absolute: 0.1 K/uL (ref 0.0–0.5)
Eosinophils Relative: 1 %
HCT: 41.8 % (ref 39.0–52.0)
Hemoglobin: 14 g/dL (ref 13.0–17.0)
Immature Granulocytes: 1 %
Lymphocytes Relative: 13 %
Lymphs Abs: 1.9 K/uL (ref 0.7–4.0)
MCH: 31.7 pg (ref 26.0–34.0)
MCHC: 33.5 g/dL (ref 30.0–36.0)
MCV: 94.8 fL (ref 80.0–100.0)
Monocytes Absolute: 1.5 K/uL — ABNORMAL HIGH (ref 0.1–1.0)
Monocytes Relative: 11 %
Neutro Abs: 10.3 K/uL — ABNORMAL HIGH (ref 1.7–7.7)
Neutrophils Relative %: 74 %
Platelets: 300 K/uL (ref 150–400)
RBC: 4.41 MIL/uL (ref 4.22–5.81)
RDW: 14.1 % (ref 11.5–15.5)
WBC: 13.9 K/uL — ABNORMAL HIGH (ref 4.0–10.5)
nRBC: 0 % (ref 0.0–0.2)

## 2024-02-07 LAB — BASIC METABOLIC PANEL WITH GFR
Anion gap: 13 (ref 5–15)
BUN: 16 mg/dL (ref 8–23)
CO2: 27 mmol/L (ref 22–32)
Calcium: 9 mg/dL (ref 8.9–10.3)
Chloride: 94 mmol/L — ABNORMAL LOW (ref 98–111)
Creatinine, Ser: 1.02 mg/dL (ref 0.61–1.24)
GFR, Estimated: >60 mL/min (ref >60)
Glucose, Bld: 134 mg/dL — ABNORMAL HIGH (ref 70–99)
Potassium: 3 mmol/L — ABNORMAL LOW (ref 3.5–5.1)
Sodium: 134 mmol/L — ABNORMAL LOW (ref 135–145)

## 2024-02-07 LAB — PROCALCITONIN: Procalcitonin: <0.1 ng/mL

## 2024-02-07 LAB — MAGNESIUM: Magnesium: 1.7 mg/dL (ref 1.7–2.4)

## 2024-02-07 LAB — CK: Total CK: 71 U/L (ref 49–397)

## 2024-02-07 MED ORDER — KETOROLAC TROMETHAMINE 15 MG/ML IJ SOLN
15.0000 mg | Freq: Four times a day (QID) | INTRAMUSCULAR | Status: DC
  Administered 2024-02-07 – 2024-02-08 (×5): 15 mg via INTRAVENOUS
  Filled 2024-02-07 (×5): qty 1

## 2024-02-07 MED ORDER — METHOCARBAMOL 500 MG PO TABS: 500.0000 mg | ORAL_TABLET | Freq: Three times a day (TID) | ORAL | Status: DC

## 2024-02-07 MED ORDER — POTASSIUM CHLORIDE CRYS ER 20 MEQ PO TBCR
40.0000 meq | EXTENDED_RELEASE_TABLET | ORAL | Status: AC
  Administered 2024-02-07 (×2): 40 meq via ORAL
  Filled 2024-02-07 (×2): qty 2

## 2024-02-07 MED ORDER — OXYCODONE HCL 5 MG PO TABS
5.0000 mg | ORAL_TABLET | ORAL | Status: DC | PRN
  Administered 2024-02-07 – 2024-02-08 (×3): 10 mg via ORAL
  Filled 2024-02-07 (×3): qty 2

## 2024-02-07 MED ORDER — ACETAMINOPHEN 500 MG PO TABS
1000.0000 mg | ORAL_TABLET | Freq: Four times a day (QID) | ORAL | Status: DC
  Administered 2024-02-07 – 2024-02-08 (×3): 1000 mg via ORAL
  Filled 2024-02-07 (×4): qty 2

## 2024-02-07 MED ORDER — HYDROMORPHONE HCL 1 MG/ML IJ SOLN
0.5000 mg | INTRAMUSCULAR | Status: DC | PRN
Start: 2024-02-07 — End: 2024-02-08
  Administered 2024-02-07 – 2024-02-08 (×3): 0.5 mg via INTRAVENOUS
  Filled 2024-02-07 (×3): qty 0.5

## 2024-02-07 MED ORDER — GUAIFENESIN ER 600 MG PO TB12
600.0000 mg | ORAL_TABLET | Freq: Two times a day (BID) | ORAL | Status: DC
  Administered 2024-02-07 – 2024-02-08 (×2): 600 mg via ORAL
  Filled 2024-02-07 (×2): qty 1

## 2024-02-07 MED ORDER — LIDOCAINE 5 % EX PTCH
1.0000 | MEDICATED_PATCH | CUTANEOUS | Status: DC
  Filled 2024-02-07: qty 1

## 2024-02-07 MED ORDER — LIDOCAINE 5 % EX PTCH
1.0000 | MEDICATED_PATCH | CUTANEOUS | Status: DC
  Administered 2024-02-07: 1 via TRANSDERMAL
  Filled 2024-02-07: qty 1

## 2024-02-07 MED ORDER — METHOCARBAMOL 500 MG PO TABS
500.0000 mg | ORAL_TABLET | Freq: Four times a day (QID) | ORAL | Status: DC
  Administered 2024-02-07 – 2024-02-08 (×4): 500 mg via ORAL
  Filled 2024-02-07 (×4): qty 1

## 2024-02-07 NOTE — Plan of Care (Signed)

## 2024-02-07 NOTE — Progress Notes (Signed)
 Mobility Specialist Progress Note:    02/07/24 1500  Mobility  Activity Ambulated with assistance  Level of Assistance Contact guard assist, steadying assist  Assistive Device Front wheel walker  Distance Ambulated (ft) 30 ft  Activity Response Tolerated well  Mobility Referral Yes  Mobility visit 1 Mobility  Mobility Specialist Start Time (ACUTE ONLY) 1015  Mobility Specialist Stop Time (ACUTE ONLY) 1030  Mobility Specialist Time Calculation (min) (ACUTE ONLY) 15 min   Pt received seated EOB w/ bed alarm going off. Pt requested assistance to the BR. +2 for safety. Ambulated to the BR then back to bed d/t needing a seated rest break before going to the chair. Left in chair w/ call bell and personal belongings in reach. All needs met.   Thersia Minder Mobility Specialist  Please contact vis Secure Chat or  Rehab Office [phone]

## 2024-02-07 NOTE — Progress Notes (Signed)
 [facility]  PROGRESS NOTE  Joseph Jarvis FMW:[id number] DOB: 09/02/1955 DOA: 02/06/2024 PCP: Joseph Aldona CROME, NP   Brief HPI:   68 year old male with history of chronic pain syndrome, liver cirrhosis, GAD, GERD, hypertension was feeling his normal self yesterday but unfortunately had an accident at home where he ended up tripping over his pet dog. The patient reported that he landed flat on his chest against the hardwood floor and he has been experiencing bruising on the right side of his chest wall and significant pain especially with coughing and deep inspiration and changes in positioning. Patient reports no head injuries. He presented to the emergency department due to uncontrolled pain.     Assessment/Plan:   Nondisplaced buckle type fracture of the anterolateral right third rib with displaced, nonsegmental fractures of the lateral right 4-7 ribs -- fortunately no pneumothorax found -- Trauma team following -Continue IV fentanyl  as needed, oxycodone  as needed -Mucolytics, bronchodilators ordered -Will add Toradol  15 mg IV every 6 hours, lidocaine  patch every 24 hours    Acute bronchitis -- supportive care with bronchodilators, cough suppressants   Acute respiratory failure with hypoxia -- this happened shortly after the IV morphine  was given in ED -- suspect from IV opioid use -- wean supplemental oxygen to room air as able    Leukocytosis -- likely reactive due to fall and acute rib fractures -- follow CBC with diff   Hypokalemia -- repleted, check Mg   GERD -- pantoprazole  ordered   Essential hypertension  -- resume home medication management -- IV hydralazine  ordered as needed   Tobacco use -- nicotine  patch ordered (21 mg)      Medications     acetaminophen   650 mg Oral Q4H   Or   acetaminophen   650 mg Rectal Q4H   fluconazole   200 mg Oral Once per day on Monday Thursday   FLUoxetine   40 mg Oral Daily   heparin   5,000 Units Subcutaneous Q8H    hydrochlorothiazide   12.5 mg Oral Daily   ipratropium-albuterol   3 mL Nebulization Q6H   loratadine   10 mg Oral Daily   losartan   50 mg Oral Daily   nicotine   21 mg Transdermal Daily   pantoprazole   40 mg Oral QPM   senna-docusate  1 tablet Oral QHS     Data Reviewed:   CBG:  No results for input(s): GLUCAP in the last 168 hours.  SpO2: 99 % O2 Flow Rate (L/min): 3 L/min    Vitals:   02/07/24 0300 02/07/24 0451 02/07/24 0839 02/07/24 0857  BP:  118/87  (!) 141/74  Pulse:  78  88  Resp:  17  18  Temp:  97.7 F (36.5 C)  98.2 F (36.8 C)  TempSrc:  Oral    SpO2: 96% 92% 94% 99%  Weight:      Height:          Data Reviewed:  Basic Metabolic Panel: Recent Labs  Lab 02/06/24 1208 02/07/24 0412  NA 134* 134*  K 3.2* 3.0*  CL 93* 94*  CO2 28 27  GLUCOSE 109* 134*  BUN 13 16  CREATININE 0.91 1.02  CALCIUM 9.4 9.0  MG 1.8 1.7    CBC: Recent Labs  Lab 02/06/24 1208 02/07/24 0412  WBC 13.4* 13.9*  NEUTROABS 10.1* 10.3*  HGB 15.6 14.0  HCT 45.2 41.8  MCV 92.8 94.8  PLT 318 300    LFT Recent Labs  Lab 02/06/24 1208  AST 16  ALT 19  ALKPHOS 71  BILITOT 1.0  PROT 7.7  ALBUMIN 3.8     Antibiotics: Anti-infectives (From admission, onward)    Start     Dose/Rate Route Frequency Ordered Stop   02/07/24 0900  fluconazole  (DIFLUCAN ) tablet 200 mg        200 mg Oral Once per day on Monday Thursday 02/06/24 1615          DVT prophylaxis: Heparin   Code Status: Full code  Family Communication: No family at bedside   CONSULTS general surgery   Subjective   Still complains of right chest wall pain due to rib fractures.   Objective    Physical Examination:  General-appears in no acute distress Heart-S1-S2, regular, no murmur auscultated Lungs-clear to auscultation bilaterally, no wheezing or crackles auscultated Abdomen-soft, nontender, no organomegaly Extremities-no edema in the lower extremities Neuro-alert, oriented x3, no  focal deficit noted   Status is: Inpatient:             Joseph Jarvis   [facility] If 7PM-7AM, please contact night-coverage at [email or web address], Office  [phone]   02/07/2024, 10:23 AM  LOS: 0 days

## 2024-02-07 NOTE — Care Management Obs Status (Signed)
 MEDICARE OBSERVATION STATUS NOTIFICATION   Patient Details  Name: Joseph Jarvis MRN: [id number] Date of Birth: Oct 17, 1955   Medicare Observation Status Notification Given:  Yes    Bridget Cordella Simmonds, LCSW 02/07/2024, 2:46 PM

## 2024-02-07 NOTE — Consult Note (Addendum)
 Joseph Jarvis [birth year]  [id number].    Requesting MD: Dr. Sabas Brod Chief Complaint/Reason for Consult: Fall  HPI: Joseph Jarvis is a 68 y.o. male with a hx of chronic pain, cirrhosis, hypertension who presented to the ED at [facility] after a fall.  Patient reports 2 days ago he tripped over his dog and fell onto his chest. No head trauma or LOC.  He reports right sided rib pain and shortness of breath.  Denies any other complaints.  He denies headache,visual changes, neck pain, back pain, abdominal pain or extremity pain.  He underwent workup and was found to have right 4-7 rib fractures and a trace pleural effusion.  He was admitted to [facility] and transferred to [facility].  Trauma was asked to see.  He is currently afebrile without tachycardia or hypotension.  He is on 3 L.  He does not wear oxygen at home.  He does report he is on nebulizers at home.  He is currently tolerating a regular diet without nausea or vomiting.  No BM.  Voiding without issues.  He has been mobilizing the room without assistive devices.  He lives at home by himself at baseline and ambulates without an assistive device.  He has support from his daughter who lives nearby.  He is not on blood thinners.  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  He smokes half pack per day.  No drug use.  ROS: ROS As above, see hpi  History reviewed. No pertinent family history.  Past Medical History:  Diagnosis Date   Arthritis    Back pain    Compartment syndrome of right lower extremity 07/12/2015   lower leg    GERD (gastroesophageal reflux disease)    Hypertension    Panic attack    Periprosthetic fracture of proximal end of tibia, right 07/12/2015   right    Past Surgical History:  Procedure Laterality Date   APPLICATION OF WOUND VAC Right 07/06/2015   Procedure:  WOUND VAC PLACEMENT;  Surgeon: Ozell Bruch, MD;  Location: [facility] OR;  Service: Orthopedics;  Laterality: Right;   DORSAL COMPARTMENT RELEASE Right 07/03/2015    Procedure: COMPARTMENT SYNDROME RELEASE RIGHT LOWER LEG, APPLICATION OF LARGE EXTERNAL FIXATOR;  Surgeon: Lonni CINDERELLA Poli, MD;  Location: [facility] OR;  Service: Orthopedics;  Laterality: Right;   ORIF TIBIA FRACTURE Right 07/03/2015   ORIF TIBIA PLATEAU Right 07/06/2015   Procedure: OPEN REDUCTION INTERNAL FIXATION (ORIF) TIBIAL PLATEAU;  Surgeon: Ozell Bruch, MD;  Location: [facility] OR;  Service: Orthopedics;  Laterality: Right;   SECONDARY CLOSURE OF WOUND Right 07/06/2015   Procedure: SECONDARY CLOSURE OF WOUND;  Surgeon: Ozell Bruch, MD;  Location: [facility] OR;  Service: Orthopedics;  Laterality: Right;   SKIN SPLIT GRAFT Right 07/09/2015   Procedure: RIGHT LEG SPLIT THICKNESS SKIN GRAFT;  Surgeon: Ozell Bruch, MD;  Location: [facility] OR;  Service: Orthopedics;  Laterality: Right;   TOTAL KNEE ARTHROPLASTY Right 10/20/2013   Procedure: TOTAL KNEE ARTHROPLASTY;  Surgeon: Norleen LITTIE Gavel, MD;  Location: [facility] OR;  Service: Orthopedics;  Laterality: Right;    Social History:  reports that he has been smoking cigarettes. He has a 30 pack-year smoking history. He has never used smokeless tobacco. He reports current alcohol use. He reports that he does not use drugs.  Allergies:  Allergies  Allergen Reactions   Penicillins Swelling    Sudden or severe rash/hives, skin peeling, or any reaction on the inside of your mouth or  nose Childhood allergy    Medications Prior to Admission  Medication Sig Dispense Refill   albuterol  (PROVENTIL  HFA) 108 (90 Base) MCG/ACT inhaler Inhale 2 puffs into the lungs every 6 (six) hours as needed for wheezing.     ALPRAZolam  (XANAX ) 1 MG tablet Take 1 mg by mouth 3 (three) times daily as needed for anxiety.     cetirizine (ZYRTEC) 10 MG tablet Take 10 mg by mouth daily.     fluconazole  (DIFLUCAN ) 200 MG tablet Take 200 mg by mouth 2 (two) times a week.     FLUoxetine  (PROZAC ) 40 MG capsule Take 40 mg by mouth daily.     hydrochlorothiazide  (MICROZIDE ) 12.5 MG capsule Take 12.5 mg  by mouth daily.     HYDROcodone -acetaminophen  (NORCO/VICODIN) 5-325 MG tablet Take 1 tablet by mouth every 4 (four) hours as needed for moderate pain (pain score 4-6).     ibuprofen  (ADVIL ) 800 MG tablet TAKE ONE TABLET BY MOUTH EVERY 8 HOURS AS NEEDED (Patient taking differently: Take 800 mg by mouth every 8 (eight) hours as needed for mild pain (pain score 1-3).) 90 tablet 5   losartan  (COZAAR ) 50 MG tablet Take 50 mg by mouth daily.     omeprazole (PRILOSEC) 40 MG capsule Take 40 mg by mouth daily.       Physical Exam: Blood pressure (!) 141/74, pulse 88, temperature 98.2 F (36.8 C), resp. rate 18, height 6' 1 (1.854 m), weight 108.6 kg, SpO2 99%. Gen:  Alert, NAD, pleasant HEENT: Normocephalic, atraumatic. PEERL. EOMI. No obvious external nasal or ear lacerations or abrasions. No racoon eyes or battle sign. No CSF otorrhea. Dentition fair  Neck: No C-spine ttp or step offs. Trachea midline.  Card:  RRR. Radial and DP pulse 2+ Pulm:  CTAB, no W/R/R, effort normal. On 2L. No IS in the room. R chest wall ttp.  Abd: Soft, ND, NT Psych: A&Ox4 Skin: Warm and dry.  Neuro: Non-focal. MAE's. SILT to BUE and BLE. CN 3-12 grossly intact Msk:  RUE: No gross deformities of joints or skin unless otherwise mentioned above. Able passive/active range of motion of major joints of RUE without reported pain. No bony tenderness to palpation of the RUE.  LUE: No gross deformities of joints or skin unless otherwise mentioned above. Able passive/active range of motion of major joints of LUE without reported pain. No bony tenderness to palpation of the LUE. RLE: No gross deformities of joints or skin unless otherwise mentioned above. No sacral crepitus.  Able passive/active range of motion of major joints of RLE without reported pain. No bony tenderness to palpation of the RLE. LLE: No gross deformities of joints or skin unless otherwise mentioned above. No sacral crepitus.  Able passive/active range of motion  of major joints of RLE without reported pain. No bony tenderness to palpation of the LLE. Back: No midline thoracic or lumbar tenderness or step-offs.    Results for orders placed or performed during the hospital encounter of 02/06/24 (from the past 48 hours)  CBC with Differential     Status: Abnormal   Collection Time: 02/06/24 12:08 PM  Result Value Ref Range   WBC 13.4 (H) 4.0 - 10.5 K/uL   RBC 4.87 4.22 - 5.81 MIL/uL   Hemoglobin 15.6 13.0 - 17.0 g/dL   HCT 54.7 60.9 - 47.9 %   MCV 92.8 80.0 - 100.0 fL   MCH 32.0 26.0 - 34.0 pg   MCHC 34.5 30.0 - 36.0 g/dL   RDW  14.0 11.5 - 15.5 %   Platelets 318 150 - 400 K/uL   nRBC 0.0 0.0 - 0.2 %   Neutrophils Relative % 75 %   Neutro Abs 10.1 (H) 1.7 - 7.7 K/uL   Lymphocytes Relative 12 %   Lymphs Abs 1.6 0.7 - 4.0 K/uL   Monocytes Relative 11 %   Monocytes Absolute 1.5 (H) 0.1 - 1.0 K/uL   Eosinophils Relative 0 %   Eosinophils Absolute 0.0 0.0 - 0.5 K/uL   Basophils Relative 1 %   Basophils Absolute 0.1 0.0 - 0.1 K/uL   Immature Granulocytes 1 %   Abs Immature Granulocytes 0.09 (H) 0.00 - 0.07 K/uL    Comment: Performed at [facility], [address] [place], [place] [address]  Comprehensive metabolic panel     Status: Abnormal   Collection Time: 02/06/24 12:08 PM  Result Value Ref Range   Sodium 134 (L) 135 - 145 mmol/L   Potassium 3.2 (L) 3.5 - 5.1 mmol/L   Chloride 93 (L) 98 - 111 mmol/L   CO2 28 22 - 32 mmol/L   Glucose, Bld 109 (H) 70 - 99 mg/dL    Comment: Glucose reference range applies only to samples taken after fasting for at least 8 hours.   BUN 13 8 - 23 mg/dL   Creatinine, Ser 9.08 0.61 - 1.24 mg/dL   Calcium 9.4 8.9 - 89.6 mg/dL   Total Protein 7.7 6.5 - 8.1 g/dL   Albumin 3.8 3.5 - 5.0 g/dL   AST 16 15 - 41 U/L   ALT 19 0 - 44 U/L   Alkaline Phosphatase 71 38 - 126 U/L   Total Bilirubin 1.0 0.0 - 1.2 mg/dL   GFR, Estimated >39 >39 mL/min    Comment: (NOTE) Calculated using the CKD-EPI Creatinine Equation  (2021)    Anion gap 13 5 - 15    Comment: Performed at [facility], [address] [place], [place] [address]  CK     Status: None   Collection Time: 02/06/24 12:08 PM  Result Value Ref Range   Total CK 105 49 - 397 U/L    Comment: Performed at [facility], [address], [place], [place] [address]  Magnesium     Status: None   Collection Time: 02/06/24 12:08 PM  Result Value Ref Range   Magnesium 1.8 1.7 - 2.4 mg/dL    Comment: Performed at [facility], [address] [place], [place] [address]  CBC with Differential/Platelet     Status: Abnormal   Collection Time: 02/07/24  4:12 AM  Result Value Ref Range   WBC 13.9 (H) 4.0 - 10.5 K/uL   RBC 4.41 4.22 - 5.81 MIL/uL   Hemoglobin 14.0 13.0 - 17.0 g/dL   HCT 58.1 60.9 - 47.9 %   MCV 94.8 80.0 - 100.0 fL   MCH 31.7 26.0 - 34.0 pg   MCHC 33.5 30.0 - 36.0 g/dL   RDW 85.8 88.4 - 84.4 %   Platelets 300 150 - 400 K/uL   nRBC 0.0 0.0 - 0.2 %   Neutrophils Relative % 74 %   Neutro Abs 10.3 (H) 1.7 - 7.7 K/uL   Lymphocytes Relative 13 %   Lymphs Abs 1.9 0.7 - 4.0 K/uL   Monocytes Relative 11 %   Monocytes Absolute 1.5 (H) 0.1 - 1.0 K/uL   Eosinophils Relative 1 %   Eosinophils Absolute 0.1 0.0 - 0.5 K/uL   Basophils Relative 0 %   Basophils  Absolute 0.1 0.0 - 0.1 K/uL   Immature Granulocytes 1 %   Abs Immature Granulocytes 0.07 0.00 - 0.07 K/uL    Comment: Performed at [facility], [address] [place], [place] [address]  Basic metabolic panel with GFR     Status: Abnormal   Collection Time: 02/07/24  4:12 AM  Result Value Ref Range   Sodium 134 (L) 135 - 145 mmol/L   Potassium 3.0 (L) 3.5 - 5.1 mmol/L   Chloride 94 (L) 98 - 111 mmol/L   CO2 27 22 - 32 mmol/L   Glucose, Bld 134 (H) 70 - 99 mg/dL    Comment: Glucose reference range applies only to samples taken after fasting for at least 8 hours.   BUN 16 8 - 23 mg/dL   Creatinine, Ser 8.97 0.61 - 1.24 mg/dL   Calcium 9.0 8.9 - 89.6 mg/dL   GFR, Estimated >39 >39  mL/min    Comment: (NOTE) Calculated using the CKD-EPI Creatinine Equation (2021)    Anion gap 13 5 - 15    Comment: Performed at [facility], [address] [place], [place] [address]  CK     Status: None   Collection Time: 02/07/24  4:12 AM  Result Value Ref Range   Total CK 71 49 - 397 U/L    Comment: Performed at [facility], [address] [place], [place] [address]  Magnesium     Status: None   Collection Time: 02/07/24  4:12 AM  Result Value Ref Range   Magnesium 1.7 1.7 - 2.4 mg/dL    Comment: Performed at [facility], [address], [place], [place] [address]  Procalcitonin     Status: None   Collection Time: 02/07/24  4:12 AM  Result Value Ref Range   Procalcitonin <0.10 ng/mL    Comment:        Interpretation: PCT (Procalcitonin) <= 0.5 ng/mL: Systemic infection (sepsis) is not likely. Local bacterial infection is possible. (NOTE)       Sepsis PCT Algorithm           Lower Respiratory Tract                                      Infection PCT Algorithm    ----------------------------     ----------------------------         PCT < 0.25 ng/mL                PCT < 0.10 ng/mL          Strongly encourage             Strongly discourage   discontinuation of antibiotics    initiation of antibiotics    ----------------------------     -----------------------------       PCT 0.25 - 0.50 ng/mL            PCT 0.10 - 0.25 ng/mL               OR       >80% decrease in PCT            Discourage initiation of  antibiotics      Encourage discontinuation           of antibiotics    ----------------------------     -----------------------------         PCT >= 0.50 ng/mL              PCT 0.26 - 0.50 ng/mL               AND        <80% decrease in PCT             Encourage initiation of                                             antibiotics       Encourage continuation           of antibiotics     ----------------------------     -----------------------------        PCT >= 0.50 ng/mL                  PCT > 0.50 ng/mL               AND         increase in PCT                  Strongly encourage                                      initiation of antibiotics    Strongly encourage escalation           of antibiotics                                     -----------------------------                                           PCT <= 0.25 ng/mL                                                 OR                                        > 80% decrease in PCT                                      Discontinue / Do not initiate                                             antibiotics  Performed at [facility], [address] [place], [place] [address]    CT CHEST ABDOMEN PELVIS W CONTRAST Result Date: 02/06/2024 CLINICAL  DATA:  Polytrauma, blunt, trip and fall. Chest and rib pain. EXAM: CT CHEST, ABDOMEN, AND PELVIS WITH CONTRAST TECHNIQUE: Multidetector CT imaging of the chest, abdomen and pelvis was performed following the standard protocol during bolus administration of intravenous contrast. RADIATION DOSE REDUCTION: This exam was performed according to the departmental dose-optimization program which includes automated exposure control, adjustment of the mA and/or kV according to patient size and/or use of iterative reconstruction technique. CONTRAST:  OMNIPAQUE  IOHEXOL  300 MG/ML  SOLN COMPARISON:  August 19, 2019, 02/06/2024 FINDINGS: CT CHEST FINDINGS Pulmonary Embolism: While the exam was not optimized for the evaluation of the pulmonary arteries, no central pulmonary embolism visualized. Cardiovascular: No cardiomegaly or pericardial effusion.No aortic aneurysm. Mediastinum/Nodes: No mediastinal mass.No mediastinal, hilar, or axillary lymphadenopathy. Lungs/Pleura: The midline trachea is patent. Mild biapical pleuroparenchymal scarring. Mild bronchial wall thickening in the left lower  lobe. Trace right pleural effusion. Bibasilar posterior dependent atelectasis. Minimal posterior dependent atelectasis also present in the right upper lobe. No pneumothorax. CT ABDOMEN PELVIS FINDINGS Hepatobiliary: No mass.No radiopaque stones or wall thickening of the gallbladder. No intrahepatic or extrahepatic biliary ductal dilation. The portal veins are patent. Pancreas: No mass or main ductal dilation. No peripancreatic inflammation or fluid collection. Spleen: Normal size. No mass. Adrenals/Urinary Tract: No adrenal masses. Similar appearance of a couple of small renal cysts bilaterally. No nephrolithiasis or hydronephrosis. The urinary bladder is completely decompressed. Stomach/Bowel: The stomach is decompressed without focal abnormality. No small bowel wall thickening or inflammation. No small bowel obstruction.Normal appendix. Total colonic diverticulosis. No changes of acute diverticulitis. Vascular/Lymphatic: No aortic aneurysm. Scattered aortoiliac atherosclerosis. No intraabdominal or pelvic lymphadenopathy. Reproductive: No prostatomegaly.No free pelvic fluid. Other: No pneumoperitoneum, ascites, or mesenteric inflammation. Musculoskeletal: Nondisplaced buckle type fracture of the anterolateral right third rib (axial 54). Mildly displaced fracture of the lateral right fourth rib with moderately displaced fractures of the lateral 5-7 ribs. Remote, healing fracture of the anterolateral right seventh rib. Remote, healed fractures of multiple anterior and posterior left ribs. Multilevel degenerative disc disease of the spine. Remote posttraumatic changes of the sternum. IMPRESSION: 1. Nondisplaced buckle type fracture of the anterolateral right third rib with displaced, nonsegmental fractures of the lateral right 4-7 ribs, as described above. Trace right pleural effusion. No pneumothorax. 2. Mild bronchial wall thickening in the left lower lobe, which may represent changes of asthma or bronchitis. No  superimposed findings of pneumonia. 3. No acute, traumatic injury within the abdomen or pelvis. Aortic Atherosclerosis (ICD10-I70.0). Electronically Signed   By: Rogelia Myers M.D.   On: 02/06/2024 14:00   DG Ribs Unilateral W/Chest Right Result Date: 02/06/2024 CLINICAL DATA:  right chest pain after fall EXAM: RIGHT RIBS AND CHEST - 3+ VIEW COMPARISON:  08/19/2019 FINDINGS: No focal airspace consolidation, pleural effusion, or pneumothorax. No cardiomegaly. There are 3, mildly displaced mid right thoracic rib fractures, the numbering of which is difficult to ascertain on the provided radiographs due to patient positioning. IMPRESSION: Three midthoracic, mildly displaced right-sided rib fractures, the numbering of which is difficult to ascertain on the provided radiographs. No pneumothorax or focal airspace consolidation. Electronically Signed   By: Rogelia Myers M.D.   On: 02/06/2024 11:20    Anti-infectives (From admission, onward)    Start     Dose/Rate Route Frequency Ordered Stop   02/07/24 0900  fluconazole  (DIFLUCAN ) tablet 200 mg        200 mg Oral Once per day on Monday Thursday 02/06/24 1615  Assessment/Plan Fall R 4-7 rib fx's with trace pleural effusion - Repeat cxr now. Pulm toilet. IS/FV. Multimodal pain control. PT/OT. Recommend prn nebs and scheduled mucinex  as well.  Hx HTN - Per [facility]/Primary   FEN - Okay for reg diet, recommend replacing hypokalemia VTE - SCDs, SQH ID -none indicated from our standpoint Foley -none, spontaneous void Dispo - CXR.  PT/OT.  Multimodal pain control.  Pulm toilet.  Remainder of care per primary   I reviewed nursing notes, hospitalist notes, last 24 h vitals and pain scores, last 48 h intake and output, last 24 h labs and trends, and last 24 h imaging results.    Ozell CHRISTELLA Shaper, [facility] 02/07/2024, 12:26 PM Please see Amion for pager number during day hours 7:00am-4:30pm

## 2024-02-08 ENCOUNTER — Other Ambulatory Visit (HOSPITAL_COMMUNITY): Payer: Self-pay

## 2024-02-08 DIAGNOSIS — J9601 Acute respiratory failure with hypoxia: Secondary | ICD-10-CM | POA: Diagnosis present

## 2024-02-08 DIAGNOSIS — J90 Pleural effusion, not elsewhere classified: Secondary | ICD-10-CM | POA: Diagnosis present

## 2024-02-08 DIAGNOSIS — Z96651 Presence of right artificial knee joint: Secondary | ICD-10-CM | POA: Diagnosis present

## 2024-02-08 DIAGNOSIS — E876 Hypokalemia: Secondary | ICD-10-CM | POA: Diagnosis present

## 2024-02-08 DIAGNOSIS — F410 Panic disorder [episodic paroxysmal anxiety] without agoraphobia: Secondary | ICD-10-CM | POA: Diagnosis present

## 2024-02-08 DIAGNOSIS — R0781 Pleurodynia: Secondary | ICD-10-CM | POA: Diagnosis not present

## 2024-02-08 DIAGNOSIS — G894 Chronic pain syndrome: Secondary | ICD-10-CM | POA: Diagnosis present

## 2024-02-08 DIAGNOSIS — W19XXXA Unspecified fall, initial encounter: Secondary | ICD-10-CM

## 2024-02-08 DIAGNOSIS — F17210 Nicotine dependence, cigarettes, uncomplicated: Secondary | ICD-10-CM | POA: Diagnosis present

## 2024-02-08 DIAGNOSIS — Z79899 Other long term (current) drug therapy: Secondary | ICD-10-CM | POA: Diagnosis not present

## 2024-02-08 DIAGNOSIS — F411 Generalized anxiety disorder: Secondary | ICD-10-CM | POA: Diagnosis present

## 2024-02-08 DIAGNOSIS — K7460 Unspecified cirrhosis of liver: Secondary | ICD-10-CM | POA: Diagnosis present

## 2024-02-08 DIAGNOSIS — K219 Gastro-esophageal reflux disease without esophagitis: Secondary | ICD-10-CM | POA: Diagnosis present

## 2024-02-08 DIAGNOSIS — S2249XA Multiple fractures of ribs, unspecified side, initial encounter for closed fracture: Secondary | ICD-10-CM | POA: Diagnosis present

## 2024-02-08 DIAGNOSIS — Y92009 Unspecified place in unspecified non-institutional (private) residence as the place of occurrence of the external cause: Secondary | ICD-10-CM | POA: Diagnosis not present

## 2024-02-08 DIAGNOSIS — J209 Acute bronchitis, unspecified: Secondary | ICD-10-CM | POA: Diagnosis present

## 2024-02-08 DIAGNOSIS — W010XXA Fall on same level from slipping, tripping and stumbling without subsequent striking against object, initial encounter: Secondary | ICD-10-CM | POA: Diagnosis present

## 2024-02-08 DIAGNOSIS — I10 Essential (primary) hypertension: Secondary | ICD-10-CM | POA: Diagnosis present

## 2024-02-08 DIAGNOSIS — Z880 Allergy status to penicillin: Secondary | ICD-10-CM | POA: Diagnosis not present

## 2024-02-08 DIAGNOSIS — S2241XA Multiple fractures of ribs, right side, initial encounter for closed fracture: Secondary | ICD-10-CM | POA: Diagnosis present

## 2024-02-08 LAB — CBC WITH DIFFERENTIAL/PLATELET
Abs Immature Granulocytes: 0.11 K/uL — ABNORMAL HIGH (ref 0.00–0.07)
Basophils Absolute: 0.1 K/uL (ref 0.0–0.1)
Basophils Relative: 1 %
Eosinophils Absolute: 0.3 K/uL (ref 0.0–0.5)
Eosinophils Relative: 3 %
HCT: 39.6 % (ref 39.0–52.0)
Hemoglobin: 13.1 g/dL (ref 13.0–17.0)
Immature Granulocytes: 1 %
Lymphocytes Relative: 16 %
Lymphs Abs: 2.2 K/uL (ref 0.7–4.0)
MCH: 31.5 pg (ref 26.0–34.0)
MCHC: 33.1 g/dL (ref 30.0–36.0)
MCV: 95.2 fL (ref 80.0–100.0)
Monocytes Absolute: 1.6 K/uL — ABNORMAL HIGH (ref 0.1–1.0)
Monocytes Relative: 12 %
Neutro Abs: 9 K/uL — ABNORMAL HIGH (ref 1.7–7.7)
Neutrophils Relative %: 67 %
Platelets: 288 K/uL (ref 150–400)
RBC: 4.16 MIL/uL — ABNORMAL LOW (ref 4.22–5.81)
RDW: 14.1 % (ref 11.5–15.5)
WBC: 13.2 K/uL — ABNORMAL HIGH (ref 4.0–10.5)
nRBC: 0 % (ref 0.0–0.2)

## 2024-02-08 LAB — BASIC METABOLIC PANEL WITH GFR
Anion gap: 8 (ref 5–15)
BUN: 18 mg/dL (ref 8–23)
CO2: 27 mmol/L (ref 22–32)
Calcium: 9 mg/dL (ref 8.9–10.3)
Chloride: 97 mmol/L — ABNORMAL LOW (ref 98–111)
Creatinine, Ser: 1.16 mg/dL (ref 0.61–1.24)
GFR, Estimated: >60 mL/min (ref >60)
Glucose, Bld: 108 mg/dL — ABNORMAL HIGH (ref 70–99)
Potassium: 3.5 mmol/L (ref 3.5–5.1)
Sodium: 132 mmol/L — ABNORMAL LOW (ref 135–145)

## 2024-02-08 LAB — CK: Total CK: 86 U/L (ref 49–397)

## 2024-02-08 MED ORDER — IPRATROPIUM-ALBUTEROL 0.5-2.5 (3) MG/3ML IN SOLN: 3.0000 mL | Freq: Two times a day (BID) | RESPIRATORY_TRACT | Status: DC

## 2024-02-08 MED ORDER — METHOCARBAMOL 500 MG PO TABS
500.0000 mg | ORAL_TABLET | Freq: Three times a day (TID) | ORAL | 0 refills | Status: AC | PRN
  Filled 2024-02-08: qty 30, 10d supply, fill #0

## 2024-02-08 MED ORDER — ALBUTEROL SULFATE (2.5 MG/3ML) 0.083% IN NEBU: 2.5000 mg | INHALATION_SOLUTION | RESPIRATORY_TRACT | Status: DC | PRN

## 2024-02-08 MED ORDER — METHOCARBAMOL 500 MG PO TABS: 1000.0000 mg | ORAL_TABLET | Freq: Four times a day (QID) | ORAL | Status: DC

## 2024-02-08 MED ORDER — OXYCODONE HCL 5 MG PO TABS
5.0000 mg | ORAL_TABLET | Freq: Four times a day (QID) | ORAL | 0 refills | Status: AC | PRN
  Filled 2024-02-08: qty 30, 4d supply, fill #0

## 2024-02-08 MED ORDER — GUAIFENESIN ER 600 MG PO TB12
600.0000 mg | ORAL_TABLET | Freq: Two times a day (BID) | ORAL | 0 refills | Status: AC
  Filled 2024-02-08: qty 15, 8d supply, fill #0

## 2024-02-08 NOTE — Evaluation (Signed)
 Physical Therapy Brief Evaluation and Discharge Note Patient Details Name: Joseph Jarvis MRN: [id number] DOB: May 20, 1955 Today's Date: 02/08/2024   History of Present Illness  Joseph Jarvis is a 68 y.o. male who presented to [facility] ED 02/06/24 after fall tripping over his dog sustained mildly displaced rib fractures. CT chest abdomen demonstrated R 3-7 rib fx, trace R pleural effusion, no trauma within abdomen and pelvis, PMH: chronic pain, cirrhosis, HTN, GERD.   Clinical Impression  Pt greeted seated in recliner chair, pleasant and agreeable to PT evaluation. PTA, pt was independent with functional mobility, ADLs, and IADLs. He lives alone in a mobile home with 4 STE. Pt reports his daughter and friends live nearby and check in on him intermittently. Introduced RW and educated pt on proper and safe use for increased support/stability. He completed transfers with modI. Pt ambulated ~221ft with a step-through gait pattern using RW with supervision. Pt ascended/descended 6 steps with a reciprocal gait pattern and BUE support on railings. Educated pt on splinting technique and use of incentive spirometer. Cued improved posture as pt rested in a slight fwd flex and right lateral flex to guard R flank d/t pain. Encouraged frequent mobilization. Pt feels ready and safe for discharge. I have answered all his questions related to mobility. He appears to be close to his baseline function, no further PT needs.      PT Assessment Patient does not need any further PT services  Assistance Needed at Discharge  PRN    Equipment Recommendations Rolling walker (2 wheels);Other (comment) (shower chair)  Recommendations for Other Services       Precautions/Restrictions Precautions Precautions: Fall Recall of Precautions/Restrictions: Intact Restrictions Weight Bearing Restrictions Per Provider Order: No        Mobility  Bed Mobility       General bed mobility comments: Not assessed. Pt  greeted seated in recliner chair and returned there at end of session.  Transfers Overall transfer level: Modified independent Equipment used: Rolling walker (2 wheels)               General transfer comment: Pt stood from recliner chair. He demonstrated proper hand placement using RW. Good eccentric control.    Ambulation/Gait Ambulation/Gait assistance: Supervision Gait Distance (Feet): 200 Feet Assistive device: Rolling walker (2 wheels) Gait Pattern/deviations: Step-through pattern, Decreased stride length, Trunk flexed Gait Speed: Pace WFL General Gait Details: Pt ambulated with a reciprocal gait pattern, even weight shift, and good foot clearence. He maintained a forward lean and slight hunch to compress R flank d/t pain. Cues for improved posture. Pt with heavy reliance on BUE support on RW.  Home Activity Instructions    Stairs Stairs: Yes Stairs assistance: Supervision Stair Management: Two rails, Forwards, Alternating pattern Number of Stairs: 2 (x3) General stair comments: Pt ascended/descended steps with a reciprocal gait pattern and BUE support.  Modified Rankin (Stroke Patients Only)        Balance Overall balance assessment: Mild deficits observed, not formally tested                        Pertinent Vitals/Pain PT - Brief Vital Signs All Vital Signs Stable: Yes Pain Assessment Pain Assessment: 0-10 Pain Score: 5  Pain Location: R ribs Pain Descriptors / Indicators: Aching, Grimacing, Guarding, Discomfort Pain Intervention(s): Monitored during session, Limited activity within patient's tolerance, Repositioned     Home Living Family/patient expects to be discharged to:: Private residence Living Arrangements: Alone Available  Help at Discharge: Family;Friend(s);Available PRN/intermittently Home Environment: Stairs to enter;Rail - right;Rail - left  Stairs-Number of Steps: 4 Home Equipment: None        Prior Function Level of  Independence: Independent Comments: Ambulates without AD. 1 fall leading to current admission. Indep with ADLs/IADLs. Cares for his dog and likes to fish. Doesn't drive.    UE/LE Assessment   UE ROM/Strength/Tone/Coordination: WFL    LE ROM/Strength/Tone/Coordination: [facility]      Communication   Communication Communication: No apparent difficulties     Cognition Overall Cognitive Status: Appears within functional limits for tasks assessed/performed       General Comments General comments (skin integrity, edema, etc.): Educated pt on splinting technique during coughing, sneezing, or laughing. Reviewed correct technique for incentive spirometer and instructed pt to complete 10 reps every hour.    Exercises     Assessment/Plan    PT Problem List         PT Visit Diagnosis Unsteadiness on feet (R26.81);Pain    No Skilled PT Patient is supervision for all activity/mobility;Patient will have necessary level of assist by caregiver at discharge;All education completed   Co-evaluation                AMPAC 6 Clicks Help needed turning from your back to your side while in a flat bed without using bedrails?: None Help needed moving from lying on your back to sitting on the side of a flat bed without using bedrails?: None Help needed moving to and from a bed to a chair (including a wheelchair)?: None Help needed standing up from a chair using your arms (e.g., wheelchair or bedside chair)?: None Help needed to walk in hospital room?: A Little Help needed climbing 3-5 steps with a railing? : A Little 6 Click Score: 22      End of Session Equipment Utilized During Treatment: Gait belt Activity Tolerance: Patient tolerated treatment well Patient left: in chair;with call bell/phone within reach;with chair alarm set Nurse Communication: Mobility status PT Visit Diagnosis: Unsteadiness on feet (R26.81);Pain Pain - Right/Left: Right Pain - part of body:  (Ribs)     Time:  8879-8868 PT Time Calculation (min) (ACUTE ONLY): 11 min  Charges:   PT Evaluation $PT Eval Low Complexity: 1 Low      Randall SAUNDERS, PT, DPT Acute Rehabilitation Services Office: [phone] Secure Chat Preferred  Delon CHRISTELLA Callander  02/08/2024, 12:50 PM

## 2024-02-08 NOTE — Evaluation (Signed)
 Occupational Therapy Evaluation and Discharge Patient Details Name: Joseph Jarvis MRN: [id number] DOB: 1956/04/04 Today's Date: 02/08/2024   History of Present Illness   Joseph Jarvis is a 68 y.o. male who presented to [facility] ED 02/06/24 after fall tripping over his dog sustained mildly displaced rib fractures. CT chest abdomen demonstrated R 3-7 rib fx, trace R pleural effusion, no trauma within abdomen and pelvis, PMH: chronic pain, cirrhosis, HTN, GERD.     Clinical Impressions Pt was independent prior to admission. He lives only with his dog, daughter and friends are nearby. Pt does not drive, likes to fish and be outdoors. Pt reports this is his only fall in the last 6 months. Pt presents with R rib pain and unsteady gait requiring RW for modified independence. Pt requests a shower seat for his walk in shower at home, case manager notified. Reinforced importance of frequent mobility and use of IS when pt discharges. No further OT needs. Pt with SpO2 of 92% with short distance ambulation. Educated in splinting ribs with pillow. Pt is eager to return home.     If plan is discharge home, recommend the following:   Assistance with cooking/housework;Assist for transportation     Functional Status Assessment   Patient has had a recent decline in their functional status and demonstrates the ability to make significant improvements in function in a reasonable and predictable amount of time.     Equipment Recommendations   Tub/shower seat (RW)     Recommendations for Other Services         Precautions/Restrictions   Precautions Precautions: Fall Recall of Precautions/Restrictions: Intact     Mobility Bed Mobility Overal bed mobility: Modified Independent                  Transfers Overall transfer level: Modified independent Equipment used: Rolling walker (2 wheels)               General transfer comment: cues for hand placement      Balance  Overall balance assessment: Needs assistance   Sitting balance-Leahy Scale: Good Sitting balance - Comments: no LOB donning socks     Standing balance-Leahy Scale: Fair Standing balance comment: prefers RW for ambulation, fair static standing                           ADL either performed or assessed with clinical judgement   ADL Overall ADL's : Modified independent                                       General ADL Comments: prefers to use RW, requests shower seat     Vision Baseline Vision/History: 1 Wears glasses Ability to See in Adequate Light: 0 Adequate Patient Visual Report: No change from baseline Additional Comments: readers     Perception         Praxis         Pertinent Vitals/Pain Pain Assessment Pain Assessment: 0-10 Pain Score: 5  Pain Location: R ribs Pain Descriptors / Indicators: Aching, Grimacing, Guarding Pain Intervention(s): Monitored during session, Premedicated before session, Repositioned     Extremity/Trunk Assessment Upper Extremity Assessment Upper Extremity Assessment: Overall WFL for tasks assessed;Left hand dominant   Lower Extremity Assessment Lower Extremity Assessment: Defer to PT evaluation   Cervical / Trunk Assessment Cervical / Trunk Assessment: Other exceptions (  R rib fxs)   Communication Communication Communication: No apparent difficulties   Cognition Arousal: Alert Behavior During Therapy: WFL for tasks assessed/performed Cognition: No apparent impairments                               Following commands: Intact       Cueing  General Comments   Cueing Techniques: Verbal cues      Exercises     Shoulder Instructions      Home Living Family/patient expects to be discharged to:: Private residence Living Arrangements: Alone Available Help at Discharge: Family;Friend(s);Available PRN/intermittently Type of Home: Mobile home Home Access: Stairs to enter Entrance  Stairs-Number of Steps: 4 Entrance Stairs-Rails: Right;Left Home Layout: One level     Bathroom Shower/Tub: Producer, television/film/video: Standard     Home Equipment: None          Prior Functioning/Environment Prior Level of Function : Independent/Modified Independent               ADLs Comments: likes to fish, doesn't drive    OT Problem List:     OT Treatment/Interventions:        OT Goals(Current goals can be found in the care plan section)   Acute Rehab OT Goals OT Goal Formulation: With patient   OT Frequency:       Co-evaluation              AM-PAC OT 6 Clicks Daily Activity     Outcome Measure Help from another person eating meals?: None Help from another person taking care of personal grooming?: None Help from another person toileting, which includes using toliet, bedpan, or urinal?: None Help from another person bathing (including washing, rinsing, drying)?: None Help from another person to put on and taking off regular upper body clothing?: None Help from another person to put on and taking off regular lower body clothing?: None 6 Click Score: 24   End of Session Equipment Utilized During Treatment: Rolling walker (2 wheels);Gait belt  Activity Tolerance: Patient tolerated treatment well Patient left: in chair;with call bell/phone within reach;with chair alarm set;with nursing/sitter in room  OT Visit Diagnosis: Unsteadiness on feet (R26.81);Pain                Time: 1040-1103 OT Time Calculation (min): 23 min Charges:  OT General Charges $OT Visit: 1 Visit OT Evaluation $OT Eval Low Complexity: 1 Low OT Treatments $Self Care/Home Management : 8-22 mins  Joseph Jarvis, OTR/L Acute Rehabilitation Services Office: [phone]   Joseph Jarvis 02/08/2024, 11:06 AM

## 2024-02-08 NOTE — Progress Notes (Signed)
    Durable Medical Equipment  (From admission, onward)           Start     Ordered   02/08/24 1222  For home use only DME Walker rolling  Once       Question Answer Comment  Walker: With 5 Inch Wheels   Patient needs a walker to treat with the following condition Gait instability      02/08/24 1222   02/08/24 1222  For home use only DME Shower stool  Once        02/08/24 1222

## 2024-02-08 NOTE — Discharge Summary (Signed)
 Physician Discharge Summary   Patient: Joseph Jarvis MRN: [id number] DOB: [birth year]  Admit date:     02/06/2024  Discharge date: 02/08/24  Discharge Physician: Joseph Jarvis   PCP: Joseph Aldona CROME, NP   Recommendations at discharge:   Follow-up PCP as outpatient  Discharge Diagnoses: Principal Problem:   Multiple rib fractures Active Problems:   Pleuritic chest pain   Hypoxia   Acute respiratory failure with hypoxia   GERD (gastroesophageal reflux disease)   Hypertension   Cirrhosis of liver without ascites (HCC)   GAD (generalized anxiety disorder)   Chronic pain syndrome   Leukocytosis   Hypokalemia  Resolved Problems:   * No resolved hospital problems. *  Hospital Course: 68 year old male with history of chronic pain syndrome, liver cirrhosis, GAD, GERD, hypertension was feeling his normal self yesterday but unfortunately had an accident at home where he ended up tripping over his pet dog. The patient reported that he landed flat on his chest against the hardwood floor and he has been experiencing bruising on the right side of his chest wall and significant pain especially with coughing and deep inspiration and changes in positioning. Patient reports no head injuries. He presented to the emergency department due to uncontrolled pain.   Assessment and Plan:  Nondisplaced buckle type fracture of the anterolateral right third rib with displaced, nonsegmental fractures of the lateral right 4-7 ribs -- fortunately no pneumothorax found -- Seen by general surgery, no further intervention recommended.  Continue incentive spirometry.  Pain control with oxycodone  and Robaxin .   Acute bronchitis -- Resolved, will discharge with Mucinex  twice a day   Acute respiratory failure with hypoxia -- this happened shortly after the IV morphine  was given in ED -- suspect from IV opioid use -- weaned off to room air   Leukocytosis -- likely reactive due to fall and acute rib  fractures    Hypokalemia -- replete      Essential hypertension  -- resume home medication management        Consultants: Surgery Procedures performed:  Disposition: Home Diet recommendation:  Discharge Diet Orders (From admission, onward)     Start     Ordered   02/08/24 0000  Diet - low sodium heart healthy        02/08/24 1426           Regular diet DISCHARGE MEDICATION: Allergies as of 02/08/2024       Reactions   Penicillins Swelling   Sudden or severe rash/hives, skin peeling, or any reaction on the inside of your mouth or nose Childhood allergy        Medication List     STOP taking these medications    HYDROcodone -acetaminophen  5-325 MG tablet Commonly known as: NORCO/VICODIN       TAKE these medications    ALPRAZolam  1 MG tablet Commonly known as: XANAX  Take 1 mg by mouth 3 (three) times daily as needed for anxiety.   cetirizine 10 MG tablet Commonly known as: ZYRTEC Take 10 mg by mouth daily.   fluconazole  200 MG tablet Commonly known as: DIFLUCAN  Take 200 mg by mouth 2 (two) times a week.   FLUoxetine  40 MG capsule Commonly known as: PROZAC  Take 40 mg by mouth daily.   guaiFENesin  600 MG 12 hr tablet Commonly known as: Mucinex  Take 1 tablet (600 mg total) by mouth 2 (two) times daily.   hydrochlorothiazide  12.5 MG capsule Commonly known as: MICROZIDE  Take 12.5 mg by mouth daily.  ibuprofen  800 MG tablet Commonly known as: ADVIL  TAKE ONE TABLET BY MOUTH EVERY 8 HOURS AS NEEDED What changed: reasons to take this   losartan  50 MG tablet Commonly known as: COZAAR  Take 50 mg by mouth daily.   methocarbamol  500 MG tablet Commonly known as: ROBAXIN  Take 1 tablet (500 mg total) by mouth every 8 (eight) hours as needed for muscle spasms.   omeprazole 40 MG capsule Commonly known as: PRILOSEC Take 40 mg by mouth daily.   oxyCODONE  5 MG immediate release tablet Commonly known as: Oxy IR/ROXICODONE  Take 1-2 tablets  (5-10 mg total) by mouth every 6 (six) hours as needed for up to 7 days for severe pain (pain score 7-10).   Proventil  HFA 108 (90 Base) MCG/ACT inhaler Generic drug: albuterol  Inhale 2 puffs into the lungs every 6 (six) hours as needed for wheezing.               Durable Medical Equipment  (From admission, onward)           Start     Ordered   02/08/24 1222  For home use only DME Walker rolling  Once       Question Answer Comment  Walker: With 5 Inch Wheels   Patient needs a walker to treat with the following condition Gait instability      02/08/24 1222   02/08/24 1222  For home use only DME Shower stool  Once        02/08/24 1222            Follow-up Information     Joseph Aldona CROME, NP Follow up on 02/18/2024.   Specialty: Family Medicine Why: 12:30 pm with Aldona Joseph Contact information: [address] [place] [address] [phone]                Discharge Exam: Joseph Jarvis   02/06/24 0901 02/07/24 0016  Weight: 108.9 kg 108.6 kg   General-appears in no acute distress Heart-S1-S2, regular, no murmur auscultated Lungs-clear to auscultation bilaterally, no wheezing or crackles auscultated Abdomen-soft, nontender, no organomegaly Extremities-no edema in the lower extremities Neuro-alert, oriented x3, no focal deficit noted  Condition at discharge: good  The results of significant diagnostics from this hospitalization (including imaging, microbiology, ancillary and laboratory) are listed below for reference.   Imaging Studies: DG CHEST PORT 1 VIEW Result Date: 02/07/2024 CLINICAL DATA:  Rib fractures.  Patient reports bilateral pain. EXAM: PORTABLE CHEST 1 VIEW COMPARISON:  Radiograph and CT yesterday FINDINGS: Again seen displaced right rib fractures. No pneumothorax, pleural effusion, or developing airspace disease. The left lung is clear. Some of the remote left rib fractures are faintly visualized. Unchanged heart size and  mediastinal contours. IMPRESSION: Displaced right rib fractures. No pneumothorax or developing airspace disease. Electronically Signed   By: Joseph Jarvis M.D.   On: 02/07/2024 18:19   CT CHEST ABDOMEN PELVIS W CONTRAST Result Date: 02/06/2024 CLINICAL DATA:  Polytrauma, blunt, trip and fall. Chest and rib pain. EXAM: CT CHEST, ABDOMEN, AND PELVIS WITH CONTRAST TECHNIQUE: Multidetector CT imaging of the chest, abdomen and pelvis was performed following the standard protocol during bolus administration of intravenous contrast. RADIATION DOSE REDUCTION: This exam was performed according to the departmental dose-optimization program which includes automated exposure control, adjustment of the mA and/or kV according to patient size and/or use of iterative reconstruction technique. CONTRAST:  OMNIPAQUE  IOHEXOL  300 MG/ML  SOLN COMPARISON:  August 19, 2019, 02/06/2024 FINDINGS: CT CHEST  FINDINGS Pulmonary Embolism: While the exam was not optimized for the evaluation of the pulmonary arteries, no central pulmonary embolism visualized. Cardiovascular: No cardiomegaly or pericardial effusion.No aortic aneurysm. Mediastinum/Nodes: No mediastinal mass.No mediastinal, hilar, or axillary lymphadenopathy. Lungs/Pleura: The midline trachea is patent. Mild biapical pleuroparenchymal scarring. Mild bronchial wall thickening in the left lower lobe. Trace right pleural effusion. Bibasilar posterior dependent atelectasis. Minimal posterior dependent atelectasis also present in the right upper lobe. No pneumothorax. CT ABDOMEN PELVIS FINDINGS Hepatobiliary: No mass.No radiopaque stones or wall thickening of the gallbladder. No intrahepatic or extrahepatic biliary ductal dilation. The portal veins are patent. Pancreas: No mass or main ductal dilation. No peripancreatic inflammation or fluid collection. Spleen: Normal size. No mass. Adrenals/Urinary Tract: No adrenal masses. Similar appearance of a couple of small renal cysts  bilaterally. No nephrolithiasis or hydronephrosis. The urinary bladder is completely decompressed. Stomach/Bowel: The stomach is decompressed without focal abnormality. No small bowel wall thickening or inflammation. No small bowel obstruction.Normal appendix. Total colonic diverticulosis. No changes of acute diverticulitis. Vascular/Lymphatic: No aortic aneurysm. Scattered aortoiliac atherosclerosis. No intraabdominal or pelvic lymphadenopathy. Reproductive: No prostatomegaly.No free pelvic fluid. Other: No pneumoperitoneum, ascites, or mesenteric inflammation. Musculoskeletal: Nondisplaced buckle type fracture of the anterolateral right third rib (axial 54). Mildly displaced fracture of the lateral right fourth rib with moderately displaced fractures of the lateral 5-7 ribs. Remote, healing fracture of the anterolateral right seventh rib. Remote, healed fractures of multiple anterior and posterior left ribs. Multilevel degenerative disc disease of the spine. Remote posttraumatic changes of the sternum. IMPRESSION: 1. Nondisplaced buckle type fracture of the anterolateral right third rib with displaced, nonsegmental fractures of the lateral right 4-7 ribs, as described above. Trace right pleural effusion. No pneumothorax. 2. Mild bronchial wall thickening in the left lower lobe, which may represent changes of asthma or bronchitis. No superimposed findings of pneumonia. 3. No acute, traumatic injury within the abdomen or pelvis. Aortic Atherosclerosis (ICD10-I70.0). Electronically Signed   By: Rogelia Myers M.D.   On: 02/06/2024 14:00   DG Ribs Unilateral W/Chest Right Result Date: 02/06/2024 CLINICAL DATA:  right chest pain after fall EXAM: RIGHT RIBS AND CHEST - 3+ VIEW COMPARISON:  08/19/2019 FINDINGS: No focal airspace consolidation, pleural effusion, or pneumothorax. No cardiomegaly. There are 3, mildly displaced mid right thoracic rib fractures, the numbering of which is difficult to ascertain on the  provided radiographs due to patient positioning. IMPRESSION: Three midthoracic, mildly displaced right-sided rib fractures, the numbering of which is difficult to ascertain on the provided radiographs. No pneumothorax or focal airspace consolidation. Electronically Signed   By: Rogelia Myers M.D.   On: 02/06/2024 11:20    Microbiology: Results for orders placed or performed during the hospital encounter of 07/03/15  MRSA PCR Screening     Status: None   Collection Time: 07/04/15  1:07 AM   Specimen: Nasal Mucosa; Nasopharyngeal  Result Value Ref Range Status   MRSA by PCR NEGATIVE NEGATIVE Final    Comment:        The GeneXpert MRSA Assay (FDA approved for NASAL specimens only), is one component of a comprehensive MRSA colonization surveillance program. It is not intended to diagnose MRSA infection nor to guide or monitor treatment for MRSA infections.     Labs: CBC: Recent Labs  Lab 02/06/24 1208 02/07/24 0412 02/08/24 0430  WBC 13.4* 13.9* 13.2*  NEUTROABS 10.1* 10.3* 9.0*  HGB 15.6 14.0 13.1  HCT 45.2 41.8 39.6  MCV 92.8 94.8 95.2  PLT 318 300  288   Basic Metabolic Panel: Recent Labs  Lab 02/06/24 1208 02/07/24 0412 02/08/24 0430  NA 134* 134* 132*  K 3.2* 3.0* 3.5  CL 93* 94* 97*  CO2 28 27 27   GLUCOSE 109* 134* 108*  BUN 13 16 18   CREATININE 0.91 1.02 1.16  CALCIUM 9.4 9.0 9.0  MG 1.8 1.7  --    Liver Function Tests: Recent Labs  Lab 02/06/24 1208  AST 16  ALT 19  ALKPHOS 71  BILITOT 1.0  PROT 7.7  ALBUMIN 3.8   CBG: No results for input(s): GLUCAP in the last 168 hours.  Discharge time spent: greater than 30 minutes.  Signed: [name], MD [facility] 02/08/2024

## 2024-02-08 NOTE — Plan of Care (Signed)
?  Problem: Education: ?Goal: Knowledge of General Education information will improve ?Description: Including pain rating scale, medication(s)/side effects and non-pharmacologic comfort measures ?Outcome: Progressing ?  ?Problem: Health Behavior/Discharge Planning: ?Goal: Ability to manage health-related needs will improve ?Outcome: Progressing ?  ?Problem: Clinical Measurements: ?Goal: Ability to maintain clinical measurements within normal limits will improve ?Outcome: Progressing ?Goal: Will remain free from infection ?Outcome: Progressing ?Goal: Diagnostic test results will improve ?Outcome: Progressing ?Goal: Respiratory complications will improve ?Outcome: Progressing ?Goal: Cardiovascular complication will be avoided ?Outcome: Progressing ?  ?Problem: Clinical Measurements: ?Goal: Will remain free from infection ?Outcome: Progressing ?  ?Problem: Clinical Measurements: ?Goal: Diagnostic test results will improve ?Outcome: Progressing ?  ?Problem: Clinical Measurements: ?Goal: Respiratory complications will improve ?Outcome: Progressing ?  ?Problem: Clinical Measurements: ?Goal: Cardiovascular complication will be avoided ?Outcome: Progressing ?  ?Problem: Activity: ?Goal: Risk for activity intolerance will decrease ?Outcome: Progressing ?  ?Problem: Nutrition: ?Goal: Adequate nutrition will be maintained ?Outcome: Progressing ?  ?

## 2024-02-08 NOTE — TOC Transition Note (Addendum)
 Transition of Care [facility]) - Discharge Note   Patient Details  Name: Joseph Jarvis MRN: [id number] Date of Birth: 1956-01-08  Transition of Care [facility]) CM/SW Contact:  Rosalva Jon Bloch, RN Phone Number: 02/08/2024, 12:45 PM   Clinical Narrative:    Patient will DC to: home  Anticipated DC date: 926/2025 Family notified: yes Transport by: car       -S/P fall, sustained mildly displaced rib fractures  Per MD patient ready for DC today . RN, patient, patient's daughter notified of DC. Pt states daughter to assist with needed care once d/c. Pt with no PT/OT f/u. Referral made with [facility] [phone]) for RW and shower stool. Equipment will be delivered to bedside prior to d/c. Family to provide transportation to home. Pt without RX MED concerns. Post hospital f/u noted on AVS.  RNCM will sign off for now as intervention is no longer needed. Please consult us  again if new needs arise.    Final next level of care: Home/Self Care Barriers to Discharge: No Barriers Identified   Patient Goals and CMS Choice     Choice offered to / list presented to : Patient      Discharge Placement                       Discharge Plan and Services Additional resources added to the After Visit Summary for                  DME Arranged: Shower stool, Walker rolling   Date DME Agency Contacted: 02/08/24 Time DME Agency Contacted: 1244 Representative spoke with at DME Agency: Darlyn            Social Drivers of Health (SDOH) Interventions SDOH Screenings   Food Insecurity: No Food Insecurity (02/07/2024)  Housing: Low Risk  (02/07/2024)  Transportation Needs: No Transportation Needs (02/07/2024)  Utilities: Not At Risk (02/07/2024)  Financial Resource Strain: Low Risk  (07/30/2023)   Received from [facility]  Physical Activity: Insufficiently Active (05/01/2023)   Received from [facility]  Social Connections: Unknown (02/07/2024)  Stress: No Stress Concern Present  (05/01/2023)   Received from [facility]  Tobacco Use: High Risk (02/06/2024)     Readmission Risk Interventions     No data to display

## 2024-02-08 NOTE — Progress Notes (Signed)
 Subjective: CC: Seen with RN's. Patient reports R sided rib pain. Some sob. Cough is now productive. Tolerating po without n/v. Voiding. Mobilizing. Asking if he can go home. Weaned to RA.   Objective: Vital signs in last 24 hours: Temp:  [97.4 F (36.3 C)-98.6 F (37 C)] 97.4 F (36.3 C) (09/26 0739) Pulse Rate:  [77-84] 77 (09/26 0739) Resp:  [16-19] 16 (09/26 0739) BP: (133-148)/(79-87) 133/80 (09/26 0739) SpO2:  [97 %-100 %] 100 % (09/26 0754) Last BM Date : 02/06/24  Intake/Output from previous day: 09/25 0701 - 09/26 0700 In: 840 [P.O.:840] Out: 200 [Urine:200] Intake/Output this shift: No intake/output data recorded.  PE: Gen:  Alert, NAD, pleasant Card:  RRR Pulm:  CTAB, no W/R/R, effort normal. On RA. Pulling 1750 on IS.  Abd: Soft, ND, NT Ext:  No LE edema    Lab Results:  Recent Labs    02/07/24 0412 02/08/24 0430  WBC 13.9* 13.2*  HGB 14.0 13.1  HCT 41.8 39.6  PLT 300 288   BMET Recent Labs    02/07/24 0412 02/08/24 0430  NA 134* 132*  K 3.0* 3.5  CL 94* 97*  CO2 27 27  GLUCOSE 134* 108*  BUN 16 18  CREATININE 1.02 1.16  CALCIUM 9.0 9.0   PT/INR No results for input(s): LABPROT, INR in the last 72 hours. CMP     Component Value Date/Time   NA 132 (L) 02/08/2024 0430   K 3.5 02/08/2024 0430   CL 97 (L) 02/08/2024 0430   CO2 27 02/08/2024 0430   GLUCOSE 108 (H) 02/08/2024 0430   BUN 18 02/08/2024 0430   CREATININE 1.16 02/08/2024 0430   CALCIUM 9.0 02/08/2024 0430   PROT 7.7 02/06/2024 1208   ALBUMIN 3.8 02/06/2024 1208   AST 16 02/06/2024 1208   ALT 19 02/06/2024 1208   ALKPHOS 71 02/06/2024 1208   BILITOT 1.0 02/06/2024 1208   GFRNONAA >60 02/08/2024 0430   GFRAA >60 08/19/2019 1421   Lipase  No results found for: LIPASE  Studies/Results: DG CHEST PORT 1 VIEW Result Date: 02/07/2024 CLINICAL DATA:  Rib fractures.  Patient reports bilateral pain. EXAM: PORTABLE CHEST 1 VIEW COMPARISON:  Radiograph and CT  yesterday FINDINGS: Again seen displaced right rib fractures. No pneumothorax, pleural effusion, or developing airspace disease. The left lung is clear. Some of the remote left rib fractures are faintly visualized. Unchanged heart size and mediastinal contours. IMPRESSION: Displaced right rib fractures. No pneumothorax or developing airspace disease. Electronically Signed   By: Andrea Gasman M.D.   On: 02/07/2024 18:19   CT CHEST ABDOMEN PELVIS W CONTRAST Result Date: 02/06/2024 CLINICAL DATA:  Polytrauma, blunt, trip and fall. Chest and rib pain. EXAM: CT CHEST, ABDOMEN, AND PELVIS WITH CONTRAST TECHNIQUE: Multidetector CT imaging of the chest, abdomen and pelvis was performed following the standard protocol during bolus administration of intravenous contrast. RADIATION DOSE REDUCTION: This exam was performed according to the departmental dose-optimization program which includes automated exposure control, adjustment of the mA and/or kV according to patient size and/or use of iterative reconstruction technique. CONTRAST:  OMNIPAQUE  IOHEXOL  300 MG/ML  SOLN COMPARISON:  August 19, 2019, 02/06/2024 FINDINGS: CT CHEST FINDINGS Pulmonary Embolism: While the exam was not optimized for the evaluation of the pulmonary arteries, no central pulmonary embolism visualized. Cardiovascular: No cardiomegaly or pericardial effusion.No aortic aneurysm. Mediastinum/Nodes: No mediastinal mass.No mediastinal, hilar, or axillary lymphadenopathy. Lungs/Pleura: The midline trachea is patent. Mild biapical pleuroparenchymal scarring.  Mild bronchial wall thickening in the left lower lobe. Trace right pleural effusion. Bibasilar posterior dependent atelectasis. Minimal posterior dependent atelectasis also present in the right upper lobe. No pneumothorax. CT ABDOMEN PELVIS FINDINGS Hepatobiliary: No mass.No radiopaque stones or wall thickening of the gallbladder. No intrahepatic or extrahepatic biliary ductal dilation. The portal  veins are patent. Pancreas: No mass or main ductal dilation. No peripancreatic inflammation or fluid collection. Spleen: Normal size. No mass. Adrenals/Urinary Tract: No adrenal masses. Similar appearance of a couple of small renal cysts bilaterally. No nephrolithiasis or hydronephrosis. The urinary bladder is completely decompressed. Stomach/Bowel: The stomach is decompressed without focal abnormality. No small bowel wall thickening or inflammation. No small bowel obstruction.Normal appendix. Total colonic diverticulosis. No changes of acute diverticulitis. Vascular/Lymphatic: No aortic aneurysm. Scattered aortoiliac atherosclerosis. No intraabdominal or pelvic lymphadenopathy. Reproductive: No prostatomegaly.No free pelvic fluid. Other: No pneumoperitoneum, ascites, or mesenteric inflammation. Musculoskeletal: Nondisplaced buckle type fracture of the anterolateral right third rib (axial 54). Mildly displaced fracture of the lateral right fourth rib with moderately displaced fractures of the lateral 5-7 ribs. Remote, healing fracture of the anterolateral right seventh rib. Remote, healed fractures of multiple anterior and posterior left ribs. Multilevel degenerative disc disease of the spine. Remote posttraumatic changes of the sternum. IMPRESSION: 1. Nondisplaced buckle type fracture of the anterolateral right third rib with displaced, nonsegmental fractures of the lateral right 4-7 ribs, as described above. Trace right pleural effusion. No pneumothorax. 2. Mild bronchial wall thickening in the left lower lobe, which may represent changes of asthma or bronchitis. No superimposed findings of pneumonia. 3. No acute, traumatic injury within the abdomen or pelvis. Aortic Atherosclerosis (ICD10-I70.0). Electronically Signed   By: Rogelia Myers M.D.   On: 02/06/2024 14:00   DG Ribs Unilateral W/Chest Right Result Date: 02/06/2024 CLINICAL DATA:  right chest pain after fall EXAM: RIGHT RIBS AND CHEST - 3+ VIEW  COMPARISON:  08/19/2019 FINDINGS: No focal airspace consolidation, pleural effusion, or pneumothorax. No cardiomegaly. There are 3, mildly displaced mid right thoracic rib fractures, the numbering of which is difficult to ascertain on the provided radiographs due to patient positioning. IMPRESSION: Three midthoracic, mildly displaced right-sided rib fractures, the numbering of which is difficult to ascertain on the provided radiographs. No pneumothorax or focal airspace consolidation. Electronically Signed   By: Rogelia Myers M.D.   On: 02/06/2024 11:20    Anti-infectives: Anti-infectives (From admission, onward)    Start     Dose/Rate Route Frequency Ordered Stop   02/07/24 0900  fluconazole  (DIFLUCAN ) tablet 200 mg        200 mg Oral Once per day on Monday Thursday 02/06/24 1615          Assessment/Plan Fall R 4-7 rib fx's with trace pleural effusion - Repeat cxr 9/25 with no PTX or pleural effusion. Pulm toilet. IS/FV. Multimodal pain control. PT/OT. Prn nebs and scheduled mucinex . Hx HTN - Per TRH/Primary    FEN - Reg VTE - SCDs, SQH ID - none indicated from our standpoint Foley -none, spontaneous void Dispo - PT/OT.  Multimodal pain control - adjusted.  Pulm toilet.  Remainder of care per primary. Trauma surgery will sign off. Please call back with questions or concerns. Recommend PCP follow up. He can follow up with us  as needed.   I reviewed nursing notes, hospitalist notes, last 24 h vitals and pain scores, last 48 h intake and output, last 24 h labs and trends, and last 24 h imaging results.  LOS: 0 days    Ozell CHRISTELLA Shaper, [facility] 02/08/2024, 9:09 AM Please see Amion for pager number during day hours 7:00am-4:30pm

## 2024-02-08 NOTE — TOC CAGE-AID Note (Signed)
 Transition of Care [facility]) - CAGE-AID Screening   Patient Details  Name: DEVAN DANZER MRN: [id number] Date of Birth: 10-05-1955  [facility] CM/SW Contact:    Kaven Cumbie E Josselin Gaulin, LCSW Phone Number: 02/08/2024, 1:29 PM   Clinical Narrative: Patient denies SA resource needs.    CAGE-AID Screening:    Have You Ever Felt You Ought to Cut Down on Your Drinking or Drug Use?: No Have People Annoyed You By Critizing Your Drinking Or Drug Use?: No Have You Felt Bad Or Guilty About Your Drinking Or Drug Use?: No Have You Ever Had a Drink or Used Drugs First Thing In The Morning to Steady Your Nerves or to Get Rid of a Hangover?: No CAGE-AID Score: 0  Substance Abuse Education Offered: Yes
